# Patient Record
Sex: Male | Born: 1985 | Race: White | Hispanic: No | Marital: Single | Smoking: Current every day smoker
Health system: Southern US, Community
[De-identification: ages and names within clinical notes are randomized; demographics above are authoritative.]

## PROBLEM LIST (undated history)

## (undated) DIAGNOSIS — L408 Other psoriasis: Secondary | ICD-10-CM

## (undated) DIAGNOSIS — L4050 Arthropathic psoriasis, unspecified: Secondary | ICD-10-CM

## (undated) DIAGNOSIS — N411 Chronic prostatitis: Secondary | ICD-10-CM

## (undated) DIAGNOSIS — M461 Sacroiliitis, not elsewhere classified: Secondary | ICD-10-CM

## (undated) DIAGNOSIS — K589 Irritable bowel syndrome without diarrhea: Secondary | ICD-10-CM

## (undated) HISTORY — DX: Arthropathic psoriasis, unspecified: L40.50

## (undated) HISTORY — DX: Sacroiliitis, not elsewhere classified: M46.1

## (undated) HISTORY — DX: Chronic prostatitis: N41.1

## (undated) HISTORY — DX: Irritable bowel syndrome, unspecified: K58.9

## (undated) HISTORY — DX: Other psoriasis: L40.8

## (undated) HISTORY — PX: CYSTOSCOPY: SUR368

## (undated) HISTORY — PX: ESOPHAGOGASTRODUODENOSCOPY: SHX1529

---
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
## 2004-03-10 ENCOUNTER — Ambulatory Visit: Payer: Self-pay | Admitting: Family Medicine

## 2004-03-25 ENCOUNTER — Encounter: Admission: RE | Admit: 2004-03-25 | Discharge: 2004-03-25 | Payer: Self-pay | Admitting: Gastroenterology

## 2004-06-18 ENCOUNTER — Encounter: Admission: RE | Admit: 2004-06-18 | Discharge: 2004-06-18 | Payer: Self-pay | Admitting: Gastroenterology

## 2006-06-24 DIAGNOSIS — I10 Essential (primary) hypertension: Secondary | ICD-10-CM | POA: Insufficient documentation

## 2006-06-24 DIAGNOSIS — L408 Other psoriasis: Secondary | ICD-10-CM

## 2006-06-24 DIAGNOSIS — G44209 Tension-type headache, unspecified, not intractable: Secondary | ICD-10-CM

## 2006-06-24 DIAGNOSIS — R319 Hematuria, unspecified: Secondary | ICD-10-CM | POA: Insufficient documentation

## 2006-06-24 DIAGNOSIS — R000 Tachycardia, unspecified: Secondary | ICD-10-CM | POA: Insufficient documentation

## 2006-06-24 DIAGNOSIS — L4050 Arthropathic psoriasis, unspecified: Secondary | ICD-10-CM | POA: Insufficient documentation

## 2006-06-24 DIAGNOSIS — G43909 Migraine, unspecified, not intractable, without status migrainosus: Secondary | ICD-10-CM | POA: Insufficient documentation

## 2006-06-24 DIAGNOSIS — L219 Seborrheic dermatitis, unspecified: Secondary | ICD-10-CM

## 2006-11-26 ENCOUNTER — Ambulatory Visit: Payer: Self-pay | Admitting: Internal Medicine

## 2006-11-26 DIAGNOSIS — K589 Irritable bowel syndrome without diarrhea: Secondary | ICD-10-CM | POA: Insufficient documentation

## 2007-11-25 ENCOUNTER — Emergency Department (HOSPITAL_COMMUNITY): Admission: EM | Admit: 2007-11-25 | Discharge: 2007-11-25 | Payer: Self-pay | Admitting: *Deleted

## 2008-02-29 ENCOUNTER — Encounter: Payer: Self-pay | Admitting: Internal Medicine

## 2009-11-07 ENCOUNTER — Encounter: Payer: Self-pay | Admitting: Internal Medicine

## 2009-11-19 ENCOUNTER — Telehealth (INDEPENDENT_AMBULATORY_CARE_PROVIDER_SITE_OTHER): Payer: Self-pay | Admitting: *Deleted

## 2009-11-21 ENCOUNTER — Ambulatory Visit: Payer: Self-pay | Admitting: Internal Medicine

## 2009-11-21 DIAGNOSIS — R634 Abnormal weight loss: Secondary | ICD-10-CM

## 2009-11-21 DIAGNOSIS — R079 Chest pain, unspecified: Secondary | ICD-10-CM | POA: Insufficient documentation

## 2009-11-22 LAB — CONVERTED CEMR LAB: Free T4: 1.06 ng/dL (ref 0.60–1.60)

## 2010-05-27 NOTE — Progress Notes (Signed)
  Phone Note Call from Patient Call back at Home Phone [phone] Call back at [phone]   Caller: Cathlean Cower Edelstein-mother Call For: Dr.Letvak Summary of Call: Pt. has lost 80 lbs in the last 2 yrs.  He has no appetite.   Pt is fatigued.  He has been having chest pains for months.   He has tsoritic arthritis. Pt hasn't been seen for almost 3 years.  Pt. can only come in to see you on Thursday.  Can you see him on Thursday? Initial call taken by: Beau Fanny,  November 19, 2009 8:43 AM  Follow-up for Phone Call        can add at 1:30PM I already have 1:45PM I think Follow-up by: Cindee Salt MD,  November 19, 2009 9:04 AM  Additional Follow-up for Phone Call Additional follow up Details #1::        Pt's mother scheduled appt. on 11/21/09 @ 1:30. Additional Follow-up by: Beau Fanny,  November 19, 2009 9:19 AM

## 2010-05-27 NOTE — Assessment & Plan Note (Signed)
Summary: 1:30 LOSING WEIGHT,FATIGUE,CHEST   Vital Signs:  Patient profile:   25 year old male Height:      69.5 inches Weight:      148.25 pounds BMI:     21.66 Temp:     98.1 degrees F oral Pulse rate:   92 / minute Pulse rhythm:   regular BP sitting:   106 / 60  (left arm) Cuff size:   regular  Vitals Entered By: Sydell Axon LPN (November 21, 2009 1:21 PM) CC: Losing weight, fatigue and chest hurts   History of Present Illness: Has had a big weight loss 25-30 # down since inital visit 80# down from 6-7 years ago  Not much appetite  Has chest pain and feels it pop going back 1.5 years but worse now Pops when he sneezes. Pain is fairly constant  No nausea or vomiting Freq blood in stools in past but not lately bowels are pretty regular  ON methotrexate for psoriasis and sacroiliitis Dr Dareen Piano is his rheumatologist  Gets tremendous pain in left lower flank in bed when he rolls over, it can be excrutiating----resolves if he stays on his back Notes urinary frequency  Preventive Screening-Counseling & Management  Alcohol-Tobacco     Smoking Status: current  Allergies: No Known Drug Allergies  Past History:  Past Medical History:  IBS Psoriasis and sacroillitis  Past Surgical History: 12/02 - renal U/S nl  Negative urine metanephrines - 04/18/2001 04/18/2001, 24hr urine arsenic screen neg -testing for contam`d water  04/03/2003, Abdominal MRI Negative   04/05/2002, colonoscopy `02, 7/04w/ mult hyperplastic polyps   11/14/2002, cystoscopy normal 07/31/2002, echo - 1/03 nl fxn   05/13/2001, EEG - normal Dr. Sharene Skeans  10/04/2002, EGD w/bx for N/V by Dr. Matthias Hughs  11/14/2002, ETT- WNL, poor aerobic fitness McDiarmid - 12/21/2001,  Social History: Occupation: Horticulturist, commercial job Single Alcohol use-occ Drug use-no Current Smoker--started smoking  ~ 3/11 due to stress Smoking Status:  current  Review of Systems General:  Complains of fever and loss of  appetite; denies fatigue; not sleeping well--freq awakening. Eyes:  Denies double vision and vision loss-1 eye. ENT:  Complains of sore throat; teeth okay--overdue for dentist. CV:  Complains of chest pain or discomfort and difficulty breathing while lying down; denies fainting, lightheadness, palpitations, and shortness of breath with exertion; gets SOB with the flank pain when he rolls over in bed. Resp:  Denies cough and shortness of breath. GI:  Complains of bloody stools, indigestion, and loss of appetite; denies nausea and vomiting; last blood in stool was about 6 weeks ago occ heartburn. GU:  Complains of urinary hesitancy; denies dysuria; occ trouble starting urine when standing No history of STD. MS:  Complains of joint pain and low back pain; denies joint swelling; some swelling in right 1st and 2nd toes. Derm:  psoriasis controlled with MTX. Neuro:  Complains of headaches; denies numbness, tingling, and weakness; rare headaches. Psych:  Denies anxiety and depression. Heme:  Denies abnormal bruising and enlarge lymph nodes. Allergy:  Denies seasonal allergies and sneezing.  Physical Exam  General:  alert.  NAD Mouth:  good dentition, pharynx pink and moist, no erythema, no exudates, no posterior lymphoid hypertrophy, no lesions, and no aphthous ulcers.   Neck:  supple, no masses, no thyromegaly, and no cervical lymphadenopathy.   Chest Wall:  seems to have localized tenderness over right 2nd costochondral junction Lungs:  normal respiratory effort, no intercostal retractions, no accessory muscle use, and normal breath sounds.  Heart:  normal rate, regular rhythm, no murmur, and no gallop.   Abdomen:  soft, non-tender, no masses, no inguinal hernia, no hepatomegaly, and no splenomegaly.   Genitalia:  wouldn't permit exam Msk:  no joint tenderness and no joint swelling.   Extremities:  no edema  Neurologic:  alert & oriented X3, strength normal in all extremities, and gait  normal.   Skin:  scattered psoriatic lesions on abdomen Axillary Nodes:  No palpable lymphadenopathy Inguinal Nodes:  No significant adenopathy Psych:  normally interactive, good eye contact, not anxious appearing, and not depressed appearing.     Impression & Recommendations:  Problem # 1:  WEIGHT LOSS, ABNORMAL (ICD-783.21) Assessment New  no worrisome systemic symptoms and exam benign recent blood work okay except elevated CRP seems to be focused on GI---decreased appetite and still some blood in stools  will check TSH, free T4, celiac panel Needs reeval by Dr Matthias Hughs  Orders: Specimen Handling (84696) T- * Misc. Laboratory test [phone]) Gastroenterology Referral (GI)  Problem # 2:  CHEST PAIN (ICD-786.50) Assessment: New CXR looks normal seems likely to be costochondritis  Orders: EKG w/ Interpretation (93000) TLB-TSH (Thyroid Stimulating Hormone) (84443-TSH) Venipuncture (41324) TLB-T4 (Thyrox), Free (40102-VO5D) T-2 View CXR (71020TC)  Complete Medication List: 1)  Methotrexate 2.5 Mg Tabs (Methotrexate sodium) .... Take four by mouth once a week 2)  Folic Acid 1 Mg Tabs (Folic acid) .Marland Kitchen.. 1 tab daily  Patient Instructions: 1)  Please try carnation instant breakfast with whole milk daily in addition to your regular diet 2)  Please schedule a follow-up appointment in 2 months.  3)  Referral Appointment Information 4)  Day/Date: 5)  Time: 6)  Place/MD: 7)  Address: 8)  Phone/Fax: 9)  Patient given appointment information. Information/Orders faxed/mailed.  Current Allergies (reviewed today): No known allergies    EKG  Procedure date:  11/21/2009  Findings:      sinus @82  Normal variant   Appended Document: 1:30 LOSING WEIGHT,FATIGUE,CHEST

## 2010-07-25 ENCOUNTER — Emergency Department (HOSPITAL_COMMUNITY): Payer: BC Managed Care – PPO

## 2010-07-25 ENCOUNTER — Emergency Department (HOSPITAL_COMMUNITY)
Admission: EM | Admit: 2010-07-25 | Discharge: 2010-07-25 | Disposition: A | Discharge status: Home / Self Care | Payer: BC Managed Care – PPO | Attending: Emergency Medicine | Admitting: Emergency Medicine

## 2010-07-25 DIAGNOSIS — R55 Syncope and collapse: Secondary | ICD-10-CM | POA: Insufficient documentation

## 2010-07-25 DIAGNOSIS — R110 Nausea: Secondary | ICD-10-CM | POA: Insufficient documentation

## 2010-07-25 DIAGNOSIS — Z79899 Other long term (current) drug therapy: Secondary | ICD-10-CM | POA: Insufficient documentation

## 2010-07-25 DIAGNOSIS — L4050 Arthropathic psoriasis, unspecified: Secondary | ICD-10-CM | POA: Insufficient documentation

## 2010-07-25 LAB — DIFFERENTIAL
Basophils Absolute: 0.1 10*3/uL (ref 0.0–0.1)
Eosinophils Absolute: 0.2 10*3/uL (ref 0.0–0.7)
Eosinophils Relative: 3 % (ref 0–5)
Lymphocytes Relative: 20 % (ref 12–46)
Lymphs Abs: 1.7 10*3/uL (ref 0.7–4.0)
Monocytes Absolute: 0.6 10*3/uL (ref 0.1–1.0)
Neutro Abs: 5.9 10*3/uL (ref 1.7–7.7)
Neutrophils Relative %: 70 % (ref 43–77)

## 2010-07-25 LAB — POCT I-STAT, CHEM 8
BUN: 12 mg/dL (ref 6–23)
Calcium, Ion: 1.1 mmol/L — ABNORMAL LOW (ref 1.12–1.32)
Chloride: 104 mEq/L (ref 96–112)
Creatinine, Ser: 1 mg/dL (ref 0.4–1.5)
Glucose, Bld: 95 mg/dL (ref 70–99)
HCT: 44 % (ref 39.0–52.0)
Hemoglobin: 15 g/dL (ref 13.0–17.0)
Potassium: 3.9 mEq/L (ref 3.5–5.1)
Sodium: 141 mEq/L (ref 135–145)

## 2010-07-25 LAB — CBC
HCT: 43 % (ref 39.0–52.0)
Hemoglobin: 14.3 g/dL (ref 13.0–17.0)
MCV: 87.4 fL (ref 78.0–100.0)
RBC: 4.92 MIL/uL (ref 4.22–5.81)
WBC: 8.4 10*3/uL (ref 4.0–10.5)

## 2010-07-25 LAB — URINE MICROSCOPIC-ADD ON

## 2010-07-25 LAB — URINALYSIS, ROUTINE W REFLEX MICROSCOPIC
Bilirubin Urine: NEGATIVE
Glucose, UA: NEGATIVE mg/dL
Hgb urine dipstick: NEGATIVE
Nitrite: NEGATIVE
Protein, ur: 30 mg/dL — AB
Specific Gravity, Urine: 1.026 (ref 1.005–1.030)
pH: 8 (ref 5.0–8.0)

## 2010-07-28 LAB — GLUCOSE, CAPILLARY: Glucose-Capillary: 91 mg/dL (ref 70–99)
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
## 2010-10-21 ENCOUNTER — Telehealth: Payer: Self-pay | Admitting: *Deleted

## 2010-10-21 NOTE — Telephone Encounter (Signed)
Pt's mother has faxed lab results from rheumatology office that she would like you to review.  She has included a note stating that she is concerned about the pt, he is continuing to lose weight and tires out easily.  This is on your desk.

## 2010-10-21 NOTE — Telephone Encounter (Signed)
I will review all this tomorrow and give her a call

## 2010-10-22 ENCOUNTER — Encounter: Payer: Self-pay | Admitting: Internal Medicine

## 2010-10-22 NOTE — Telephone Encounter (Signed)
Discussed with mom MPV is not sig issue--rest of labs are normal Weight is up 6-7# from my last visit last summer (but down 10# from this winter) Discussed that he needs to be eating right and drinking plenty of fluids when working out in this heat I can see him if there are ongoing concerns

## 2010-12-09 ENCOUNTER — Inpatient Hospital Stay: Payer: Self-pay | Admitting: Internal Medicine

## 2010-12-10 DIAGNOSIS — R55 Syncope and collapse: Secondary | ICD-10-CM

## 2011-01-23 LAB — CBC
HCT: 42
Hemoglobin: 14.1
MCHC: 33.7
MCV: 87.2
Platelets: 211
RBC: 4.81
RDW: 12.8
WBC: 8.7

## 2011-01-23 LAB — URINE MICROSCOPIC-ADD ON

## 2011-01-23 LAB — POCT I-STAT, CHEM 8
BUN: 10
Calcium, Ion: 1.23
Chloride: 104
Creatinine, Ser: 1.1
Glucose, Bld: 101 — ABNORMAL HIGH
HCT: 43
Hemoglobin: 14.6
Potassium: 3.8
Sodium: 142
TCO2: 28

## 2011-01-23 LAB — DIFFERENTIAL
Basophils Absolute: 0
Basophils Relative: 0
Eosinophils Absolute: 0.1
Eosinophils Relative: 1
Lymphocytes Relative: 17
Lymphs Abs: 1.5
Monocytes Absolute: 0.4
Monocytes Relative: 4
Neutro Abs: 6.8
Neutrophils Relative %: 78 — ABNORMAL HIGH

## 2011-01-23 LAB — URINALYSIS, ROUTINE W REFLEX MICROSCOPIC
Bilirubin Urine: NEGATIVE
Glucose, UA: NEGATIVE
Ketones, ur: NEGATIVE
Leukocytes, UA: NEGATIVE
Nitrite: NEGATIVE
Protein, ur: NEGATIVE
Specific Gravity, Urine: 1.02
Urobilinogen, UA: 1
pH: 7.5

## 2011-06-18 ENCOUNTER — Emergency Department: Payer: Self-pay | Admitting: Emergency Medicine

## 2011-06-18 LAB — BASIC METABOLIC PANEL
Calcium, Total: 9.2 mg/dL (ref 8.5–10.1)
Chloride: 103 mmol/L (ref 98–107)
Co2: 25 mmol/L (ref 21–32)
Glucose: 122 mg/dL — ABNORMAL HIGH (ref 65–99)
Potassium: 3.3 mmol/L — ABNORMAL LOW (ref 3.5–5.1)
Sodium: 144 mmol/L (ref 136–145)

## 2011-06-18 LAB — URINALYSIS, COMPLETE
Bacteria: NONE SEEN
Bilirubin,UR: NEGATIVE
Glucose,UR: NEGATIVE mg/dL (ref 0–75)
Leukocyte Esterase: NEGATIVE
Ph: 6 (ref 4.5–8.0)
RBC,UR: 116 /HPF (ref 0–5)
Squamous Epithelial: NONE SEEN

## 2011-06-18 LAB — CBC
HGB: 15.1 g/dL (ref 13.0–18.0)
MCH: 29.2 pg (ref 26.0–34.0)
MCHC: 33.2 g/dL (ref 32.0–36.0)
MCV: 88 fL (ref 80–100)
Platelet: 277 10*3/uL (ref 150–440)
RDW: 14.9 % — ABNORMAL HIGH (ref 11.5–14.5)

## 2011-09-22 ENCOUNTER — Emergency Department: Payer: Self-pay | Admitting: Emergency Medicine

## 2011-09-22 LAB — BASIC METABOLIC PANEL
BUN: 9 mg/dL (ref 7–18)
Calcium, Total: 9.4 mg/dL (ref 8.5–10.1)
Chloride: 104 mmol/L (ref 98–107)
EGFR (African American): >60
EGFR (Non-African Amer.): >60
Glucose: 153 mg/dL — ABNORMAL HIGH (ref 65–99)
Osmolality: 287 (ref 275–301)
Potassium: 3.7 mmol/L (ref 3.5–5.1)

## 2011-09-22 LAB — URINALYSIS, COMPLETE
Bilirubin,UR: NEGATIVE
Nitrite: NEGATIVE
Ph: 5 (ref 4.5–8.0)
Protein: 100
RBC,UR: 274 /HPF (ref 0–5)
Squamous Epithelial: <1
WBC UR: 4 /HPF (ref 0–5)

## 2011-09-22 LAB — CBC
HGB: 15.5 g/dL (ref 13.0–18.0)
MCH: 28.9 pg (ref 26.0–34.0)
MCHC: 32.6 g/dL (ref 32.0–36.0)
MCV: 89 fL (ref 80–100)
Platelet: 277 10*3/uL (ref 150–440)
WBC: 13.9 10*3/uL — ABNORMAL HIGH (ref 3.8–10.6)

## 2011-12-29 ENCOUNTER — Ambulatory Visit (INDEPENDENT_AMBULATORY_CARE_PROVIDER_SITE_OTHER): Payer: Self-pay | Admitting: Internal Medicine

## 2011-12-29 ENCOUNTER — Encounter: Payer: Self-pay | Admitting: Internal Medicine

## 2011-12-29 VITALS — BP 108/70 | HR 81 | Temp 98.0°F | Ht 69.0 in | Wt 130.0 lb

## 2011-12-29 DIAGNOSIS — N419 Inflammatory disease of prostate, unspecified: Secondary | ICD-10-CM

## 2011-12-29 DIAGNOSIS — R350 Frequency of micturition: Secondary | ICD-10-CM

## 2011-12-29 LAB — POCT URINALYSIS DIPSTICK
Bilirubin, UA: NEGATIVE
Blood, UA: NEGATIVE
Glucose, UA: NEGATIVE
Ketones, UA: NEGATIVE
Leukocytes, UA: NEGATIVE
Nitrite, UA: NEGATIVE
Protein, UA: NEGATIVE
Spec Grav, UA: 1.01
Urobilinogen, UA: NEGATIVE
pH, UA: 8

## 2011-12-29 MED ORDER — CIPROFLOXACIN HCL 500 MG PO TABS: 500.0000 mg | ORAL_TABLET | Freq: Two times a day (BID) | ORAL | Status: AC

## 2011-12-29 NOTE — Progress Notes (Signed)
  Subjective:    Patient ID: Sean Walters, male    DOB: 09/28/1985, 26 y.o.   MRN: [id number]  HPI Out of work---disabled due to his psoriatic arthritis No insurance Getting embrel for free Not seeing rheumatologist in a while ---last was Dr Leonia Corona that was a while ago Mom helped hiim get embrel but ???noone monitoring it  Still smoking [place] be considering stopping 1-800 QUIT NOW info given  Having "kidney" issues Had kidney stones a few months ago--got analgesics and Chopin have passed it Still gets sense of something at end of penis---not coming out Took some of brother's left over antibiotics (for sore throat)----[place] have helped some  Never had intercourse Gets some frequency and some urgency No burning dysuria---did have burning only after 1st antibiotic dose Current Outpatient Prescriptions on File Prior to Visit  Medication Sig Dispense Refill  . etanercept (ENBREL) 50 MG/ML injection Inject 50 mg into the skin once a week.        No Known Allergies  Past Medical History  Diagnosis Date  . Psoriatic arthritis     Dr Gavin Potters  . Irritable bowel syndrome   . Other psoriasis   . Sacroiliitis     Past Surgical History  Procedure Date  . US echocardiography 07/31/02  . Esophagogastroduodenoscopy [id number]  . Cystoscopy [id number]    normal    Family History  Problem Relation Age of Onset  . Coronary artery disease Neg Hx   . Hypertension Neg Hx   . Diabetes Neg Hx     History   Social History  . Marital Status: Single    Spouse Name: N/A    Number of Children: N/A  . Years of Education: N/A   Occupational History  . Out of work--disabled due to arthritis    Social History Main Topics  . Smoking status: Current Everyday Smoker -- 2 years    Types: Cigarettes  . Smokeless tobacco: Never Used   Comment: gave 1-800-quit now  . Alcohol Use: Yes  . Drug Use: No  . Sexually Active: Not on file   Other Topics Concern  . Not on file   Social History  Narrative  . No narrative on file   Review of Systems Some left lumbar back pain Has lost even more weight    Objective:   Physical Exam  Constitutional: He appears well-developed. No distress.  Genitourinary:       Normal urethra No scrotal pain or swelling Rectal exam shows no mass Prostate is not boggy but is tender with pain radiating to penis          Assessment & Plan:

## 2011-12-29 NOTE — Assessment & Plan Note (Signed)
Seems the most likely diagnosis On embrel but without rheum follow up--he is working on disability or Medicaid Not clear this could be related Will treat with cipro for 3 weeks Really needs to get back with Dr Gavin Potters

## 2012-02-22 ENCOUNTER — Ambulatory Visit: Payer: Self-pay | Admitting: Internal Medicine

## 2012-02-22 ENCOUNTER — Encounter: Payer: Self-pay | Admitting: Internal Medicine

## 2012-02-22 ENCOUNTER — Ambulatory Visit (INDEPENDENT_AMBULATORY_CARE_PROVIDER_SITE_OTHER): Payer: Self-pay | Admitting: Internal Medicine

## 2012-02-22 VITALS — BP 120/70 | HR 96 | Temp 98.2°F | Wt 134.0 lb

## 2012-02-22 DIAGNOSIS — R55 Syncope and collapse: Secondary | ICD-10-CM | POA: Insufficient documentation

## 2012-02-22 DIAGNOSIS — L408 Other psoriasis: Secondary | ICD-10-CM

## 2012-02-22 MED ORDER — DOXYCYCLINE HYCLATE 100 MG PO TABS: 100.0000 mg | ORAL_TABLET | Freq: Two times a day (BID) | ORAL | Status: DC

## 2012-02-22 NOTE — Patient Instructions (Signed)
This spell was vasovagal or neurogenic syncope.

## 2012-02-22 NOTE — Assessment & Plan Note (Signed)
Recurred ?related to the enbrel Will retreat for 3 weeks
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
## 2012-02-22 NOTE — Assessment & Plan Note (Signed)
Classic story and this has happened to him before Discussed that this is not a seizure No EKG needed Will defer labs since no insurance but if recurrent symptoms, would need to check  Counseled about proper way to get down immediately if recurrent symptoms

## 2012-02-23 ENCOUNTER — Telehealth: Payer: Self-pay

## 2012-02-23 MED ORDER — DOXYCYCLINE MONOHYDRATE 100 MG PO CAPS: 100.0000 mg | ORAL_CAPSULE | Freq: Two times a day (BID) | ORAL | Status: DC

## 2012-02-23 NOTE — Telephone Encounter (Signed)
Please change the Rx to the doxycycline monohydrate 100mg  #42 x 0

## 2012-02-23 NOTE — Telephone Encounter (Signed)
rx sent to pharmacy by e-script  

## 2012-02-23 NOTE — Telephone Encounter (Signed)
Vibra tab which is Doxycycline hyclate has shortage of raw material and price increase. Pt has no insurance and med cost $150. Pt could not afford. Request another med substitute; April said has Doxycycline monohydrate  would cost $25.00 or whatever med Dr Alphonsus Sias would chose to substitute.Please advise.

## 2012-02-24 ENCOUNTER — Telehealth: Payer: Self-pay | Admitting: *Deleted

## 2012-02-24 ENCOUNTER — Other Ambulatory Visit: Payer: Self-pay | Admitting: Internal Medicine

## 2012-02-24 ENCOUNTER — Other Ambulatory Visit: Payer: Self-pay

## 2012-02-24 DIAGNOSIS — R55 Syncope and collapse: Secondary | ICD-10-CM

## 2012-02-24 NOTE — Telephone Encounter (Signed)
Mom calling asking for lab work on patient, since pt has been approved for [employer]	[place], mom would like labs for enbrel and anything else he can get. Please advise what labs need to be drawn and if they would be covered under the cone payment plan.

## 2012-02-24 NOTE — Telephone Encounter (Signed)
I will ask Sean Walters to comment on whether they are covered but I think they are He needs CBC with diff, met b, hepatic ----long term use of high risk meds TSH--syncope

## 2012-02-24 NOTE — Telephone Encounter (Signed)
Spoke with mom and scheduled lab appt for 02/25/12

## 2012-02-25 ENCOUNTER — Other Ambulatory Visit (INDEPENDENT_AMBULATORY_CARE_PROVIDER_SITE_OTHER): Payer: Self-pay

## 2012-02-25 DIAGNOSIS — R55 Syncope and collapse: Secondary | ICD-10-CM

## 2012-02-25 LAB — CBC WITH DIFFERENTIAL/PLATELET
Basophils Relative: 1 % (ref 0.0–3.0)
Eosinophils Relative: 4.6 % (ref 0.0–5.0)
HCT: 44.9 % (ref 39.0–52.0)
Lymphs Abs: 2.7 10*3/uL (ref 0.7–4.0)
MCV: 90.5 fl (ref 78.0–100.0)
Monocytes Absolute: 0.5 10*3/uL (ref 0.1–1.0)
Neutro Abs: 5 10*3/uL (ref 1.4–7.7)
RBC: 4.97 Mil/uL (ref 4.22–5.81)
WBC: 8.6 10*3/uL (ref 4.5–10.5)

## 2012-02-25 LAB — COMPREHENSIVE METABOLIC PANEL
BUN: 13 mg/dL (ref 6–23)
CO2: 32 mEq/L (ref 19–32)
Creatinine, Ser: 0.9 mg/dL (ref 0.4–1.5)
GFR: 102.3 mL/min (ref >60.00)
Glucose, Bld: 91 mg/dL (ref 70–99)
Sodium: 140 mEq/L (ref 135–145)
Total Bilirubin: 1.2 mg/dL (ref 0.3–1.2)
Total Protein: 7 g/dL (ref 6.0–8.3)

## 2012-02-26 ENCOUNTER — Encounter: Payer: Self-pay | Admitting: Family Medicine

## 2012-04-05 ENCOUNTER — Ambulatory Visit (INDEPENDENT_AMBULATORY_CARE_PROVIDER_SITE_OTHER): Payer: Self-pay | Admitting: Internal Medicine

## 2012-04-05 ENCOUNTER — Encounter: Payer: Self-pay | Admitting: Internal Medicine

## 2012-04-05 VITALS — BP 116/78 | HR 92 | Temp 98.5°F | Resp 20 | Ht 69.0 in | Wt 135.5 lb

## 2012-04-05 DIAGNOSIS — N411 Chronic prostatitis: Secondary | ICD-10-CM | POA: Insufficient documentation

## 2012-04-05 MED ORDER — TAMSULOSIN HCL 0.4 MG PO CAPS: 0.4000 mg | ORAL_CAPSULE | Freq: Every day | ORAL | Status: DC

## 2012-04-05 MED ORDER — SULFAMETHOXAZOLE-TRIMETHOPRIM 800-160 MG PO TABS: 1.0000 | ORAL_TABLET | Freq: Two times a day (BID) | ORAL | Status: AC

## 2012-04-05 NOTE — Progress Notes (Signed)
  Subjective:    Patient ID: Sean Walters, male    DOB: [birth year], 26 y.o.   MRN: [id number]  HPI Had the prostate symptoms since July or August After visit in September, took the 3 weeks of antibiotics---did help slightly but has persisted  Has discomfort---sense of urine there or urgency (but it doesn't come out) Voids okay Slight burning with voiding No hematuria Still has not had sexual activity ever  Current Outpatient Prescriptions on File Prior to Visit  Medication Sig Dispense Refill  . etanercept (ENBREL) 50 MG/ML injection Inject 50 mg into the skin once a week.      . doxycycline (MONODOX) 100 MG capsule Take 1 capsule (100 mg total) by mouth 2 (two) times daily.  42 capsule  0    No Known Allergies  Past Medical History  Diagnosis Date  . Psoriatic arthritis     Dr Gavin Potters  . Irritable bowel syndrome   . Other psoriasis   . Sacroiliitis     Past Surgical History  Procedure Date  . US echocardiography 07/31/02  . Esophagogastroduodenoscopy [id number]  . Cystoscopy [id number]    normal    Family History  Problem Relation Age of Onset  . Coronary artery disease Neg Hx   . Hypertension Neg Hx   . Diabetes Neg Hx     History   Social History  . Marital Status: Single    Spouse Name: N/A    Number of Children: N/A  . Years of Education: N/A   Occupational History  . Out of work--disabled due to arthritis    Social History Main Topics  . Smoking status: Current Every Day Smoker -- 2 years    Types: Cigarettes  . Smokeless tobacco: Never Used     Comment: gave 1-800-quit now  . Alcohol Use: Yes  . Drug Use: No  . Sexually Active: Not on file   Other Topics Concern  . Not on file   Social History Narrative  . No narrative on file   Review of Systems No fever Bowels are fine  Appetite is okay     Objective:   Physical Exam  Constitutional: He appears well-developed and well-nourished. No distress.  Abdominal: Soft. He exhibits no mass.  There is no tenderness. There is no guarding.       No suprapubic dullness  Genitourinary:       Left spermatocele but no scrotal or penile lesions or inflammation           Assessment & Plan:

## 2012-04-05 NOTE — Assessment & Plan Note (Signed)
Ongoing symptoms No clear response to the doxy Can't afford the cipro (was very expensive)  Will treat with septra for 3-6 weeks Try tamsulosin also

## 2012-06-11 ENCOUNTER — Other Ambulatory Visit: Payer: Self-pay

## 2012-12-13 ENCOUNTER — Ambulatory Visit (INDEPENDENT_AMBULATORY_CARE_PROVIDER_SITE_OTHER): Payer: Self-pay | Admitting: Family Medicine

## 2012-12-13 ENCOUNTER — Encounter: Payer: Self-pay | Admitting: Family Medicine

## 2012-12-13 VITALS — BP 100/70 | HR 105 | Temp 98.5°F | Wt 130.5 lb

## 2012-12-13 DIAGNOSIS — N411 Chronic prostatitis: Secondary | ICD-10-CM

## 2012-12-13 LAB — POCT URINALYSIS DIPSTICK
Bilirubin, UA: NEGATIVE
Glucose, UA: NEGATIVE
Nitrite, UA: NEGATIVE
pH, UA: 7.5

## 2012-12-13 MED ORDER — SULFAMETHOXAZOLE-TMP DS 800-160 MG PO TABS: 1.0000 | ORAL_TABLET | Freq: Two times a day (BID) | ORAL | Status: DC

## 2012-12-13 NOTE — Addendum Note (Signed)
Addended by: Josph Macho A on: 12/13/2012 12:25 PM   Modules accepted: Orders

## 2012-12-13 NOTE — Progress Notes (Signed)
  Subjective:    Patient ID: Sean Walters, male    DOB: December 05, 1985, 27 y.o.   MRN: [id number]  HPI CC: prostatitis?  Pleasant 27 yo with h/o psoriatic arthritis, chronic prostatitis, IBS presents today with 2wk h/o incomplete emptying, dysuria.  Feels need to void but no urine comes out at times, other time incomplete emptying.  Some nausea. Off enbrel for the last 4 months.  No fevers/chills, vomiting. No frequency, urgency, hematuria. H/o bulging disc with arthritis, chronic back pain from this.  H/o chronic prostate infection in past, doxy 2wk course didn't help.  Improved with bactrim 4 wk course.  Also treated with flomax which Diaz have helped.  Tachycardia - states heart rate tends to run in 100s.  However today even higher.  Staying hydrated with gatorade.  Drinks sodas regularly.  Does not like taste of water.  Past Medical History  Diagnosis Date  . Psoriatic arthritis     Dr Gavin Potters  . Irritable bowel syndrome   . Other psoriasis   . Sacroiliitis     Review of Systems Per HPI    Objective:   Physical Exam  Nursing note and vitals reviewed. Constitutional: He appears well-developed and well-nourished. No distress.  Abdominal: Soft. Normal appearance and bowel sounds are normal. He exhibits no distension and no mass. There is no tenderness. There is no rigidity, no rebound, no guarding, no CVA tenderness and negative Murphy's sign.  Genitourinary: Rectum normal. Rectal exam shows no external hemorrhoid, no internal hemorrhoid, no fissure, no mass, no tenderness and anal tone normal. Prostate is enlarged (slightly boggy. pain with palpation referred to head of penis) and tender.  Penile psoriatic plaques  Musculoskeletal: He exhibits no edema.       Assessment & Plan:

## 2012-12-13 NOTE — Assessment & Plan Note (Signed)
Never fully cleared per patient. This is how prior prostatitis presented. Anticipate flare of chronic prostatitis. Will treat with another prolonged bactrim course - 4 wks with 2 refills.

## 2012-12-13 NOTE — Patient Instructions (Signed)
This seems like another chronic prostate infection.  Treat with bactrim twice daily for next 3 months (2 refills provided today). If not improving with this, or any worsening, please return to see [place] or seek urgent care.  Prostatitis The prostate gland is about the size and shape of a walnut. It is located just below your bladder. It produces one of the components of semen, which is made up of sperm and the fluids that help nourish and transport it out from the testicles. Prostatitis is redness, soreness, and swelling (inflammation) of the prostate gland.  There are 3 types of prostatitis:  Acute bacterial prostatitis This is the least common type of prostatitis. It starts quickly and usually leads to a bladder infection. It can occur at any age.  Chronic bacterial prostatitis This is a persistent bacterial infection in the prostate. It usually develops from repeated acute bacterial prostatitis or acute bacterial prostatitis that was not properly treated. It can occur in men of any age but is most common in middle-aged men whose prostate has begun to enlarge.   Chronic prostatitis chronic pelvic pain syndrome This is the most common type of prostatitis. It is inflammation of the prostate gland that is not caused by a bacterial infection. The cause is unknown. CAUSES The cause of acute and chronic bacterial prostatitis is a bacterial infection. The exact cause of chronic prostatitis and chronic pelvic pain syndrome and asymptomatic inflammatory prostatitis is unknown.  SYMPTOMS  Symptoms can vary depending upon the type of prostatitis that exists. There can also be overlap in symptoms. Possible symptoms for each type of prostatitis are listed below. Acute bacterial prostatitis  Painful urination.  Fever or chills.  Muscle or joint pains.  Low back pain.  Low abdominal pain.  Inability to empty bladder completely.  Sudden urge to urinate.  Frequent urination.  Difficulty starting  urine stream.  Weak urine stream.  Discharge from the urethra.  Dribbling after urination.  Rectal pain.  Pain in the testicles, penis, or tip of the penis.  Pain in the space between the anus and scrotum (perineum).  Problems with sexual function.  Painful ejaculation.  Bloody semen. Chronic bacterial prostatitis  The symptoms are similar to those of acute bacterial prostatitis, but they usually are much less severe. Fever, chills, and muscle and joint pain are not associated with chronic bacterial prostatitis. Chronic prostatitis chronic pelvic pain syndrome  Symptoms typically include a dull ache in the scrotum and the perineum. DIAGNOSIS  In order to diagnose prostatitis, your caregiver will ask about your symptoms. If acute or chronic bacterial prostatitis is suspected, a urine sample will be taken and tested (urinalysis). This is to see if there is bacteria in your urine. If the urinalysis result is negative for bacteria, your caregiver Rendleman use a finger to feel your prostate (digital rectal exam). This exam helps your caregiver determine if your prostate is swollen and tender. TREATMENT  Treatment for prostatitis depends on the cause. If a bacterial infection is the cause, it can be treated with antibiotic medicine. In cases of chronic bacterial prostatitis, the use of antibiotics for up to 1 month Aubry be necessary. Your caregiver Avis instruct you to take sitz baths to help relieve pain. A sitz bath is a bath of hot water in which your hips and buttocks are under water. HOME CARE INSTRUCTIONS   Take all medicines as directed by your caregiver.  Take sitz baths as directed by your caregiver. SEEK MEDICAL CARE IF:  Your symptoms get worse, not better.  You have a fever. SEEK IMMEDIATE MEDICAL CARE IF:   You have chills.  You feel nauseous or vomit.  You feel lightheaded or faint.  You are unable to urinate.  You have blood or blood clots in your urine. Document  Released: 04/10/2000 Document Revised: 07/06/2011 Document Reviewed: 03/16/2011 [facility] Patient Information 2014 [place], [place].

## 2012-12-16 ENCOUNTER — Encounter: Payer: Self-pay | Admitting: Internal Medicine

## 2012-12-19 ENCOUNTER — Telehealth: Payer: Self-pay | Admitting: Family Medicine

## 2012-12-19 NOTE — Telephone Encounter (Signed)
That is actually only a little over a trace of protein Yes, it probably is from the prostate infection

## 2012-12-19 NOTE — Telephone Encounter (Signed)
Pt's mother left vm concerned about the level of protein in his urine after seeing MyChart labs.  She wants to know if this has anything to do with his dx of prostatitis.

## 2012-12-20 NOTE — Telephone Encounter (Signed)
Left message on machine with results, advised mom to call if any questions 

## 2012-12-29 ENCOUNTER — Encounter: Payer: Self-pay | Admitting: Internal Medicine

## 2012-12-29 ENCOUNTER — Ambulatory Visit (INDEPENDENT_AMBULATORY_CARE_PROVIDER_SITE_OTHER): Payer: Self-pay | Admitting: Internal Medicine

## 2012-12-29 VITALS — BP 100/70 | HR 98 | Temp 98.1°F | Wt 131.0 lb

## 2012-12-29 DIAGNOSIS — N411 Chronic prostatitis: Secondary | ICD-10-CM

## 2012-12-29 MED ORDER — TAMSULOSIN HCL 0.4 MG PO CAPS: 0.4000 mg | ORAL_CAPSULE | Freq: Every day | ORAL | Status: DC

## 2012-12-29 NOTE — Assessment & Plan Note (Signed)
Finally some better now Not on embrel so concerning that he has had ongoing problems Will have him stay on the antibiotic for 2 months Restart and stay on tamsulosin If recurs, will set up with urologist

## 2012-12-29 NOTE — Progress Notes (Signed)
  Subjective:    Patient ID: Sean Walters, male    DOB: 1985-09-15, 27 y.o.   MRN: [id number]  HPI Here with dad  His mom was actually the one emailing Discussed that prostate cancer is not really a concern at his age  Has cut back on sodas Discussed stopping smoking  Is now feeling some better on the antibiotic Feels 85-90% better No dysuria now----but Kawabata have some stinging if he holds it No blood  Never completely got better from the December infection  Not on embrel since December Psoriasis has not been bad Doesn't think the enbrel helps the arthritis much though  Current Outpatient Prescriptions on File Prior to Visit  Medication Sig Dispense Refill  . sulfamethoxazole-trimethoprim (BACTRIM DS) 800-160 MG per tablet Take 1 tablet by mouth 2 (two) times daily.  42 tablet  2   No current facility-administered medications on file prior to visit.    No Known Allergies  Past Medical History  Diagnosis Date  . Psoriatic arthritis     Dr Gavin Potters  . Irritable bowel syndrome   . Other psoriasis   . Sacroiliitis     Past Surgical History  Procedure Laterality Date  . US echocardiography  07/31/02  . Esophagogastroduodenoscopy  [id number]  . Cystoscopy  [id number]    normal    Family History  Problem Relation Age of Onset  . Coronary artery disease Neg Hx   . Hypertension Neg Hx   . Diabetes Neg Hx     History   Social History  . Marital Status: Single    Spouse Name: N/A    Number of Children: N/A  . Years of Education: N/A   Occupational History  . Out of work--disabled due to arthritis    Social History Main Topics  . Smoking status: Current Every Day Smoker -- 1.00 packs/day for 2 years    Types: Cigarettes  . Smokeless tobacco: Never Used     Comment: gave 1-800-quit now  . Alcohol Use: No  . Drug Use: No  . Sexual Activity: Not on file   Other Topics Concern  . Not on file   Social History Narrative  . No narrative on file   Review of  Systems Appetite is okay but not great Weight is stable No nausea or vomiting    Objective:   Physical Exam  Constitutional: He appears well-developed and well-nourished. No distress.  Abdominal: Soft. He exhibits no distension and no mass. There is no tenderness. There is no rebound and no guarding.          Assessment & Plan:

## 2012-12-29 NOTE — Patient Instructions (Signed)
Please take the antibiotic for 2 months. Start and stay on the tamsulosin If your prostate symptoms worsen, let me know and I will set you up with a urologist

## 2013-03-02 ENCOUNTER — Other Ambulatory Visit: Payer: Self-pay

## 2013-10-16 ENCOUNTER — Encounter: Payer: Self-pay | Admitting: Family Medicine

## 2013-10-16 ENCOUNTER — Ambulatory Visit (INDEPENDENT_AMBULATORY_CARE_PROVIDER_SITE_OTHER): Payer: Self-pay | Admitting: Family Medicine

## 2013-10-16 ENCOUNTER — Telehealth: Payer: Self-pay | Admitting: Internal Medicine

## 2013-10-16 VITALS — BP 110/60 | HR 88 | Temp 98.7°F | Wt 130.8 lb

## 2013-10-16 DIAGNOSIS — L255 Unspecified contact dermatitis due to plants, except food: Secondary | ICD-10-CM

## 2013-10-16 DIAGNOSIS — L237 Allergic contact dermatitis due to plants, except food: Secondary | ICD-10-CM | POA: Insufficient documentation

## 2013-10-16 MED ORDER — TRIAMCINOLONE ACETONIDE 0.1 % EX CREA: 1.0000 "application " | TOPICAL_CREAM | Freq: Two times a day (BID) | CUTANEOUS | Status: DC

## 2013-10-16 MED ORDER — CLOBETASOL PROPIONATE 0.05 % EX CREA: 1.0000 "application " | TOPICAL_CREAM | Freq: Two times a day (BID) | CUTANEOUS | Status: DC

## 2013-10-16 NOTE — Telephone Encounter (Signed)
Patient was seen today. Patient was given a prescription for a steroid cream and was told to call be if it was too expensive.  Patient said the cream cost $275.  [facility]

## 2013-10-16 NOTE — Telephone Encounter (Signed)
New cream sent in, spoke with patient.

## 2013-10-16 NOTE — Telephone Encounter (Signed)
For Dr Reece AgarG to review

## 2013-10-16 NOTE — Progress Notes (Signed)
Pre visit review using our clinic review tool, if applicable. No additional management support is needed unless otherwise documented below in the visit note. 

## 2013-10-16 NOTE — Progress Notes (Signed)
   BP 110/60  Pulse 88  Temp(Src) 98.7 F (37.1 C) (Oral)  Wt 130 lb 12 oz (59.308 kg)   CC: skin rash  Subjective:    Patient ID: Sean Walters, male    DOB: Feb 10, 1986, 28 y.o.   MRN: [id number]  HPI: Sean BoucheJoshua M Mones is a 28 y.o. male presenting on 10/16/2013 for Poison Ivy   4d h/o pruritic skin rash - started after worked on back deck to remove deck. Priola have been poison ivy. Bilateral forearms.  Has been treating with calomine lotion which made rash itch worse.  No new lotions/ detergents, soaps or shampoo.  Relevant past medical, surgical, family and social history reviewed and updated as indicated.  Allergies and medications reviewed and updated. No current outpatient prescriptions on file prior to visit.   No current facility-administered medications on file prior to visit.    Review of Systems Per HPI unless specifically indicated above    Objective:    BP 110/60  Pulse 88  Temp(Src) 98.7 F (37.1 C) (Oral)  Wt 130 lb 12 oz (59.308 kg)  Physical Exam  Nursing note and vitals reviewed. Constitutional: He appears well-developed and well-nourished. No distress.  Skin: Skin is warm and dry. Rash noted. There is erythema.  Pruritic erythematous papular rash with small vesicles as well - some in linear distribution - on bilateral forearms       Assessment & Plan:   Problem List Items Addressed This Visit   Poison ivy dermatitis - Primary     Given isolated to forearms will treat with topical steroid cream (temovate). rec benadryl for night time itch (discussed sedation precautions). Update if not improving as expected. Advised to price out cream. Handout provided for patient information        Follow up plan: Return if symptoms worsen or fail to improve.

## 2013-10-16 NOTE — Assessment & Plan Note (Signed)
Given isolated to forearms will treat with topical steroid cream (temovate). rec benadryl for night time itch (discussed sedation precautions). Update if not improving as expected. Advised to price out cream. Handout provided for patient information

## 2013-10-16 NOTE — Telephone Encounter (Signed)
Seen by Dr. Reece AgarG, not Dr. Alphonsus SiasLetvak.

## 2013-10-16 NOTE — Patient Instructions (Signed)
Treat with temovate cream, benadryl for night time itching Poison [facility] Poison ivy is a inflammation of the skin (contact dermatitis) caused by touching the allergens on the leaves of the ivy plant following previous exposure to the plant. The rash usually appears 48 hours after exposure. The rash is usually bumps (papules) or blisters (vesicles) in a linear pattern. Depending on your own sensitivity, the rash Kueker simply cause redness and itching, or it Mcclenny also progress to blisters which Struckman break open. These must be well cared for to prevent secondary bacterial (germ) infection, followed by scarring. Keep any open areas dry, clean, dressed, and covered with an antibacterial ointment if needed. The eyes Fedorko also get puffy. The puffiness is worst in the morning and gets better as the day progresses. This dermatitis usually heals without scarring, within 2 to 3 weeks without treatment. HOME CARE INSTRUCTIONS  Thoroughly wash with soap and water as soon as you have been exposed to poison ivy. You have about one half hour to remove the plant resin before it will cause the rash. This washing will destroy the oil or antigen on the skin that is causing, or will cause, the rash. Be sure to wash under your fingernails as any plant resin there will continue to spread the rash. Do not rub skin vigorously when washing affected area. Poison ivy cannot spread if no oil from the plant remains on your body. A rash that has progressed to weeping sores will not spread the rash unless you have not washed thoroughly. It is also important to wash any clothes you have been wearing as these Mackiewicz carry active allergens. The rash will return if you wear the unwashed clothing, even several days later. Avoidance of the plant in the future is the best measure. Poison ivy plant can be recognized by the number of leaves. Generally, poison ivy has three leaves with flowering branches on a single stem. Diphenhydramine Dibella be purchased over the  counter and used as needed for itching. Do not drive with this medication if it makes you drowsy.Ask your caregiver about medication for children. SEEK MEDICAL CARE IF:  Open sores develop.  Redness spreads beyond area of rash.  You notice purulent (pus-like) discharge.  You have increased pain.  Other signs of infection develop (such as fever). Document Released: 04/10/2000 Document Revised: 07/06/2011 Document Reviewed: 02/27/2009 [facility] Patient Information 2015 [place]. This information is not intended to replace advice given to you by your health care provider. Make sure you discuss any questions you have with your health care provider.

## 2013-10-17 ENCOUNTER — Telehealth: Payer: Self-pay | Admitting: Internal Medicine

## 2013-10-17 NOTE — Telephone Encounter (Signed)
Relevant patient education mailed to patient.  

## 2014-07-25 ENCOUNTER — Ambulatory Visit (INDEPENDENT_AMBULATORY_CARE_PROVIDER_SITE_OTHER): Payer: Self-pay | Admitting: Family Medicine

## 2014-07-25 ENCOUNTER — Telehealth: Payer: Self-pay | Admitting: Internal Medicine

## 2014-07-25 ENCOUNTER — Encounter: Payer: Self-pay | Admitting: Family Medicine

## 2014-07-25 VITALS — BP 118/64 | HR 122 | Temp 97.7°F | Ht 69.0 in | Wt 128.5 lb

## 2014-07-25 DIAGNOSIS — R3915 Urgency of urination: Secondary | ICD-10-CM

## 2014-07-25 DIAGNOSIS — N411 Chronic prostatitis: Secondary | ICD-10-CM

## 2014-07-25 LAB — POCT URINALYSIS DIPSTICK
BILIRUBIN UA: NEGATIVE
Blood, UA: NEGATIVE
GLUCOSE UA: NEGATIVE
KETONES UA: NEGATIVE
Leukocytes, UA: NEGATIVE
NITRITE UA: NEGATIVE
Spec Grav, UA: >=1.03
Urobilinogen, UA: 0.2
pH, UA: 6

## 2014-07-25 MED ORDER — LEVOFLOXACIN 500 MG PO TABS
500.0000 mg | ORAL_TABLET | Freq: Every day | ORAL | Status: DC
Start: 2014-07-25 — End: 2014-11-02

## 2014-07-25 NOTE — Progress Notes (Signed)
   Dr. Karleen HampshireSpencer T. Jaydalee Bardwell, MD, CAQ Sports Medicine [facility] [address] [place], [address] Phone: [phone] Fax: [phone]  07/25/2014  Patient: Sean Walters, MRN: [id number], DOB: 1985-06-20, 29 y.o.  Primary Physician:  [name], MD  Chief Complaint: Urinary Urgency and Nausea  Subjective:   Sean Walters is a 29 y.o. very pleasant male patient who presents with the following:  Chronic prostatis. Pleasant gentleman who has been treated a few times for chronic prostatitis. Previously he has been treated with Septra. He did have some resolution of his symptoms at that time, but now he presents with pain in his perineal area and some in the hypogastric region. He occasionally has some urgency and nausea also.  Past Medical History, Surgical History, Social History, Family History, Problem List, Medications, and Allergies have been reviewed and updated if relevant.  ROS: GEN: Acute illness details above GI: Tolerating PO intake GU: maintaining adequate hydration and urination Pulm: No SOB Interactive and getting along well at home.  Otherwise, ROS is as per the HPI.   Objective:   BP 118/64 mmHg  Pulse 122  Temp(Src) 97.7 F (36.5 C) (Oral)  Ht 5\' 9"  (1.753 m)  Wt 128 lb 8 oz (58.287 kg)  BMI 18.97 kg/m2   GEN: WDWN, NAD, Non-toxic, Alert & Oriented x 3 HEENT: Atraumatic, Normocephalic.  Ears and Nose: No external deformity. EXTR: No clubbing/cyanosis/edema NEURO: Normal gait.  PSYCH: Normally interactive. Conversant. Not depressed or anxious appearing.  Calm demeanor.    Results for orders placed or performed in visit on 07/25/14  POCT urinalysis dipstick  Result Value Ref Range   Color, UA yellow    Clarity, UA clear    Glucose, UA negative    Bilirubin, UA negative    Ketones, UA negative    Spec Grav, UA >=1.030    Blood, UA negative    pH, UA 6.0    Protein, UA trace    Urobilinogen, UA 0.2    Nitrite, UA  negative    Leukocytes, UA Negative       Laboratory and Imaging Data:  Assessment and Plan:   Chronic prostatitis without hematuria  Urinary urgency - Plan: POCT urinalysis dipstick  Reviewed article from Up To Date, resisted case, I am going to treat this gentleman 3 months of antibiotics. Arranged for him to get cost-effective treatment with specific susceptibility goals.  Follow-up: No Follow-up on file.  New Prescriptions   LEVOFLOXACIN (LEVAQUIN) 500 MG TABLET    Take 1 tablet (500 mg total) by mouth daily.   Orders Placed This Encounter  Procedures  . POCT urinalysis dipstick    Signed,  Karleen HampshireSpencer T. Armya Westerhoff, MD   Patient's Medications  New Prescriptions   LEVOFLOXACIN (LEVAQUIN) 500 MG TABLET    Take 1 tablet (500 mg total) by mouth daily.  Previous Medications   DIMENHYDRINATE (DRAMAMINE) 50 MG TABLET    Take 50 mg by mouth as needed.   TRIAMCINOLONE CREAM (KENALOG) 0.1 %    Apply 1 application topically 2 (two) times daily. Apply to AA.  Modified Medications   No medications on file  Discontinued Medications   No medications on file

## 2014-07-25 NOTE — Telephone Encounter (Signed)
emmi emailed °

## 2014-07-25 NOTE — Progress Notes (Signed)
Pre visit review using our clinic review tool, if applicable. No additional management support is needed unless otherwise documented below in the visit note. 

## 2014-11-01 ENCOUNTER — Telehealth: Payer: Self-pay | Admitting: Internal Medicine

## 2014-11-01 NOTE — Telephone Encounter (Signed)
It sounds as if someone needs to try to work the patient in or he needs to go to urgent care.   (I have seen once historically and Dr. Alphonsus SiasLetvak is PCP)  I am going to get him involved, he Haymond not even have prostatitis which was his original question.   Dee: can you call the patient and help sort out the acuity

## 2014-11-01 NOTE — Telephone Encounter (Signed)
Patient's mom,Alesia,called.  She said patient needs to be seen by someone right away.  Patient is very weak,nausea,trouble urinating.  Please call patient back at cell phone number.

## 2014-11-01 NOTE — Telephone Encounter (Signed)
Spoke with patient and he thought he would just get a prescription and referral. I advised it's been since 2014 since he was last seen and would need OV, pt scheduled for tomorrow at 10:15am

## 2014-11-01 NOTE — Telephone Encounter (Signed)
Dr. Alphonsus SiasLetvak and I both spoke about it, and we think that getting a evaluation from Urology is a good idea.   It looks like he has been seen 6 times for prostatitis in the last few years.   We can set that up if he is ok with it.

## 2014-11-01 NOTE — Telephone Encounter (Signed)
Pt called stating he has taken all his antibiotic for prostate infection.  It is coming back and he wanted to know if you would call him in something else or does he need to come in.  [facility]

## 2014-11-01 NOTE — Telephone Encounter (Signed)
Okay to add on today at end of schedule

## 2014-11-02 ENCOUNTER — Ambulatory Visit (INDEPENDENT_AMBULATORY_CARE_PROVIDER_SITE_OTHER): Payer: Self-pay | Admitting: Internal Medicine

## 2014-11-02 ENCOUNTER — Encounter: Payer: Self-pay | Admitting: Internal Medicine

## 2014-11-02 VITALS — BP 100/80 | HR 125 | Temp 98.0°F | Wt 122.0 lb

## 2014-11-02 DIAGNOSIS — N411 Chronic prostatitis: Secondary | ICD-10-CM

## 2014-11-02 LAB — POCT URINALYSIS DIPSTICK
Bilirubin, UA: NEGATIVE
Glucose, UA: NEGATIVE
Ketones, UA: NEGATIVE
LEUKOCYTES UA: NEGATIVE
NITRITE UA: NEGATIVE
PH UA: 6.5
RBC UA: NEGATIVE
Spec Grav, UA: 1.02
UROBILINOGEN UA: NEGATIVE

## 2014-11-02 MED ORDER — TAMSULOSIN HCL 0.4 MG PO CAPS: 0.4000 mg | ORAL_CAPSULE | Freq: Every day | ORAL | Status: DC

## 2014-11-02 MED ORDER — CIPROFLOXACIN HCL 500 MG PO TABS: 500.0000 mg | ORAL_TABLET | Freq: Two times a day (BID) | ORAL | Status: DC

## 2014-11-02 NOTE — Assessment & Plan Note (Signed)
Had best symptom relief with levaquin but has never gone away Will give cipro (more focal Rx for prostate) Start tamsulosin Urology eval

## 2014-11-02 NOTE — Progress Notes (Signed)
Pre visit review using our clinic review tool, if applicable. No additional management support is needed unless otherwise documented below in the visit note. 

## 2014-11-02 NOTE — Progress Notes (Signed)
   Subjective:    Patient ID: Sean Walters, male    DOB: 02/07/1986, 29 y.o.   MRN: [id number]  HPI Here for ongoing prostate symptoms  Felt like he never had symptoms gone over the past 2 years  Reviewed past prostate problems Felt ~95% better after the Rx Then started with more urgency "sick feeling in the bottom of gut" Stinging at urethra and feels like he doesn't empty  No fever No hematuria [place] No hematospermia  Current Outpatient Prescriptions on File Prior to Visit  Medication Sig Dispense Refill  . dimenhyDRINATE (DRAMAMINE) 50 MG tablet Take 50 mg by mouth as needed.     No current facility-administered medications on file prior to visit.    No Known Allergies  Past Medical History  Diagnosis Date  . [facility]     [facility]  . Irritable bowel syndrome   . Other psoriasis   . Sacroiliitis     Past Surgical History  Procedure Laterality Date  . [place] echocardiography  07/31/02  . Esophagogastroduodenoscopy  [id number]  . Cystoscopy  [id number]    normal    Family History  Problem Relation Age of Onset  . Coronary artery disease Neg Hx   . Hypertension Neg Hx   . Diabetes Neg Hx     History   Social History  . Marital Status: Single    Spouse Name: N/A  . Number of Children: N/A  . Years of Education: N/A   Occupational History  . Out of work--disabled due to arthritis    Social History Main Topics  . Smoking status: Current Every Day Smoker -- 1.00 packs/day for 2 years    Types: Cigarettes  . Smokeless tobacco: Never Used     Comment: gave 1-800-quit now  . Alcohol Use: No  . Drug Use: No  . Sexual Activity: Not on file   Other Topics Concern  . Not on file   Social History Narrative   Review of Systems Weight stable Sleep is not great---uses dramamine    Objective:   Physical Exam  Constitutional: He appears well-developed and well-nourished. No distress.  Abdominal: Soft. Bowel sounds are normal. He exhibits no  distension. There is no tenderness. There is no rebound and no guarding.  Genitourinary:  Scrotum and urethra normal Symmetric moderately enlarged prostate Moderate tenderness          Assessment & Plan:

## 2014-11-06 ENCOUNTER — Ambulatory Visit (INDEPENDENT_AMBULATORY_CARE_PROVIDER_SITE_OTHER): Payer: Self-pay | Admitting: Urology

## 2014-11-06 VITALS — BP 102/68 | HR 105 | Ht 71.0 in | Wt 126.0 lb

## 2014-11-06 DIAGNOSIS — N3020 Other chronic cystitis without hematuria: Secondary | ICD-10-CM

## 2014-11-06 DIAGNOSIS — N411 Chronic prostatitis: Secondary | ICD-10-CM

## 2014-11-06 LAB — URINALYSIS, COMPLETE
Bilirubin, UA: NEGATIVE
Glucose, UA: NEGATIVE
LEUKOCYTES UA: NEGATIVE
NITRITE UA: NEGATIVE
Specific Gravity, UA: >1.03 — ABNORMAL HIGH (ref 1.005–1.030)
Urobilinogen, Ur: 1 mg/dL (ref 0.2–1.0)
pH, UA: 7 (ref 5.0–7.5)

## 2014-11-06 LAB — MICROSCOPIC EXAMINATION: BACTERIA UA: NONE SEEN

## 2014-11-06 LAB — BLADDER SCAN AMB NON-IMAGING: SCAN RESULT: 38

## 2014-11-06 NOTE — Progress Notes (Signed)
I have been asked to see the patient by Sean Walters. Sean Walters Walters, for evaluation and management of chronic prostatitis.  History of present illness: Sean Walters Walters is a 29 year old male who for the past 18-20 months has had intermittent suprapubic pressure, a constant sense of urinary urgency, and dysuria or burning at the tip of his urethra and the feeling of incomplete bladder emptying. The patient is been treated with Bactrim for several weeks, Levaquin for 3 months, and now is on ciprofloxacin. Each time he completes a course of antibody is he feels that the symptoms nearly resolved but don't completely stop. He also recently was started on tamsulosin, he is not sure whether this helps at this point or not. The patient denies any hematuria or incontinence. He does endorse intermittent urinary frequency and urgency. When he has a flare he can get up once an hour at night. Normally, he voids once a night. The patient does not have any previous history of urinary tract infection.  The patient has a history of psoriasis with associated arthritis.  His past history is otherwise unremarkable.  The patient relates that he drinks 6 or 7 sodas a day in the form of Sean Walters Walters. Sean Walters Walters. He occasionally will drink Gatorade as well. He rarely drinks any water. He does not eat spicy food.  Review of systems: A 12 point comprehensive review of systems was obtained and is negative unless otherwise stated in the history of present illness.  Patient Active Problem List   Diagnosis Date Noted  . Poison ivy dermatitis 10/16/2013  . Chronic prostatitis 04/05/2012  . Vasovagal syncope 02/22/2012  . IBS 11/26/2006  . TENSION HEADACHE 06/24/2006  . MIGRAINE, UNSPEC., Walters/O INTRACTABLE MIGRAINE 06/24/2006  . HYPERTENSION, BENIGN SYSTEMIC 06/24/2006  . SEBORRHEIC DERMATITIS, NOS 06/24/2006  . PSORIASIS 06/24/2006  . TACHYCARDIA 06/24/2006    Current Outpatient Prescriptions on File Prior to Visit  Medication Sig  Dispense Refill  . ciprofloxacin (CIPRO) 500 MG tablet Take 1 tablet (500 mg total) by mouth 2 (two) times daily. (Patient not taking: Reported on 11/06/2014) 84 tablet 0  . dimenhyDRINATE (DRAMAMINE) 50 MG tablet Take 50 mg by mouth as needed.    . tamsulosin (FLOMAX) 0.4 MG CAPS capsule Take 1 capsule (0.4 mg total) by mouth daily. (Patient not taking: Reported on 11/06/2014) 30 capsule 3   No current facility-administered medications on file prior to visit.    Past Medical History  Diagnosis Date  . Psoriatic arthritis     Sean Walters Walters  . Irritable bowel syndrome   . Other psoriasis   . Sacroiliitis   . Chronic prostatitis     Past Surgical History  Procedure Laterality Date  . [place] echocardiography  07/31/02  . Esophagogastroduodenoscopy  [id number]  . Cystoscopy  [id number]    normal    History  Substance Use Topics  . Smoking status: Current Every Day Smoker -- 1.00 packs/day for 2 years    Types: Cigarettes  . Smokeless tobacco: Never Used     Comment: gave 1-800-quit now  . Alcohol Use: No    Family History  Problem Relation Age of Onset  . Coronary artery disease Neg Hx   . Hypertension Neg Hx   . Diabetes Neg Hx   . Kidney cancer Neg Hx   . Prostate cancer Neg Hx     PE: Filed Vitals:   11/06/14 1528  BP: 102/68  Pulse: 105  Height: 5\' 11"  (1.803 m)  Weight: 126 lb (57.153  kg)   Patient appears to be in no acute distress  patient is alert and oriented x3 Atraumatic normocephalic head No cervical or supraclavicular lymphadenopathy appreciated No increased work of breathing, no audible wheezes/rhonchi Regular sinus rhythm/rate Abdomen is soft, nontender, nondistended, no CVA or suprapubic tenderness Lower extremities are symmetric without appreciable edema Grossly neurologically intact No identifiable skin lesions  No results for input(s): WBC, HGB, HCT in the last 72 hours. No results for input(s): NA, K, CL, CO2, GLUCOSE, BUN, CREATININE, CALCIUM in  the last 72 hours. No results for input(s): LABPT, INR in the last 72 hours. No results for input(s): LABURIN in the last 72 hours. Results for orders placed or performed in visit on 11/06/14  Microscopic Examination     Status: Abnormal   Collection Time: 11/06/14  3:35 PM  Result Value Ref Range Status   WBC, UA 0-5 0 -  5 /hpf Final   RBC, UA 3-10 (A) 0 -  2 /hpf Final   Epithelial Cells (non renal) 0-10 0 - 10 /hpf Final   Mucus, UA Present (A) Not Estab. Final   Bacteria, UA None seen None seen/Few Final    Imaging: none  Imp/Recommendations:  the patient has what appears to be inflammation within his bladder, although today his urinalysis is normal. Symptoms would suggest that he has bladder irritation and is exacerbated by bladder irritants including the excessive amount of sodas that he drinks during the day. Clearly, this is not an infection as it is not responded well to the infection at all. We discussed treatment options at this point and I think it is reasonable to try pelvic floor physical therapy which Sean Walters Walters help relax his pelvic floor and reduces inflammation. I also recommended that he consider taking ibuprofen 800 mg 3 times daily for 2 weeks. In addition, I encouraged the patient to make some dietary changes to eliminate his sodium intake and increase his water intake. I think the patient should continue taking the tamsulosin. I told him he should complete his ciprofloxacin prescription that he has currently. I will plan to follow-up with the patient in 6-8 weeks. If the patient's symptoms are resolved, he does not need to follow-up.    Sean Walters Walters

## 2014-12-17 ENCOUNTER — Ambulatory Visit: Payer: Self-pay

## 2014-12-17 ENCOUNTER — Encounter: Payer: Self-pay | Admitting: Urology

## 2015-01-30 ENCOUNTER — Encounter: Payer: Self-pay | Admitting: Internal Medicine

## 2015-01-30 ENCOUNTER — Ambulatory Visit (INDEPENDENT_AMBULATORY_CARE_PROVIDER_SITE_OTHER): Payer: Self-pay | Admitting: Internal Medicine

## 2015-01-30 ENCOUNTER — Ambulatory Visit: Payer: Self-pay | Admitting: Obstetrics and Gynecology

## 2015-01-30 VITALS — BP 100/60 | HR 80 | Temp 97.7°F | Wt 129.0 lb

## 2015-01-30 DIAGNOSIS — R3915 Urgency of urination: Secondary | ICD-10-CM

## 2015-01-30 DIAGNOSIS — R300 Dysuria: Secondary | ICD-10-CM

## 2015-01-30 LAB — POCT URINALYSIS DIPSTICK
Bilirubin, UA: NEGATIVE
GLUCOSE UA: NEGATIVE
Ketones, UA: NEGATIVE
LEUKOCYTES UA: NEGATIVE
NITRITE UA: NEGATIVE
RBC UA: NEGATIVE
Spec Grav, UA: 1.02
UROBILINOGEN UA: NEGATIVE
pH, UA: 7

## 2015-01-30 MED ORDER — CIPROFLOXACIN HCL 500 MG PO TABS: 500.0000 mg | ORAL_TABLET | Freq: Two times a day (BID) | ORAL | Status: DC

## 2015-01-30 NOTE — Assessment & Plan Note (Signed)
Reviewed Dr Herrick's evaluation From resolution off the sodas--and recurrence back on-- it seems clear that it is acting as a bladder irritant Will give 10 days of antibiotic again since it has seemed to help

## 2015-01-30 NOTE — Progress Notes (Signed)
   Subjective:    Patient ID: Sean Walters, male    DOB: 1985/08/24, 29 y.o.   MRN: [id number]  HPI Having urinary symptoms again Did stop soda after Dr Herrick's visit but symptoms got better and he is back to using 4 or more sodas a day  Done with antibiotic and tamsulosin (didn't refill this)  Started again with symptoms 2-3 days ago Worse last night No fever but gets hot Some shakes in evening--but usually can cool himself off Slight stinging dysuria Stream slightly less thick Has the urgency but not always much comes out  No current outpatient prescriptions on file prior to visit.   No current facility-administered medications on file prior to visit.    No Known Allergies  Past Medical History  Diagnosis Date  . Psoriatic arthritis (HCC)     Dr Gavin Potters  . Irritable bowel syndrome   . Other psoriasis   . Sacroiliitis (HCC)   . Chronic prostatitis     Past Surgical History  Procedure Laterality Date  . US echocardiography  07/31/02  . Esophagogastroduodenoscopy  [id number]  . Cystoscopy  [id number]    normal    Family History  Problem Relation Age of Onset  . Coronary artery disease Neg Hx   . Hypertension Neg Hx   . Diabetes Neg Hx   . Kidney cancer Neg Hx   . Prostate cancer Neg Hx     Social History   Social History  . Marital Status: Single    Spouse Name: N/A  . Number of Children: N/A  . Years of Education: N/A   Occupational History  . Out of work--disabled due to arthritis    Social History Main Topics  . Smoking status: Current Every Day Smoker -- 1.00 packs/day for 2 years    Types: Cigarettes  . Smokeless tobacco: Never Used     Comment: gave 1-800-quit now  . Alcohol Use: No  . Drug Use: No  . Sexual Activity: Not on file   Other Topics Concern  . Not on file   Social History Narrative   Caffeine use: 6+   Review of Systems  Nausea but no vomiting or diarrhea Appetite is off some No rash     Objective:   Physical Exam    Constitutional: He appears well-developed and well-nourished. No distress.  Abdominal: Soft. He exhibits no distension. There is no tenderness. There is no rebound and no guarding.          Assessment & Plan:

## 2015-01-30 NOTE — Progress Notes (Signed)
Pre visit review using our clinic review tool, if applicable. No additional management support is needed unless otherwise documented below in the visit note. 

## 2015-01-31 ENCOUNTER — Encounter: Payer: Self-pay | Admitting: Internal Medicine

## 2017-10-05 ENCOUNTER — Encounter: Payer: Self-pay | Admitting: Family Medicine

## 2017-10-05 ENCOUNTER — Ambulatory Visit (INDEPENDENT_AMBULATORY_CARE_PROVIDER_SITE_OTHER): Payer: Self-pay | Admitting: Family Medicine

## 2017-10-05 VITALS — BP 118/68 | HR 120 | Temp 98.4°F | Ht 69.0 in | Wt 124.0 lb

## 2017-10-05 DIAGNOSIS — R000 Tachycardia, unspecified: Secondary | ICD-10-CM

## 2017-10-05 DIAGNOSIS — L237 Allergic contact dermatitis due to plants, except food: Secondary | ICD-10-CM

## 2017-10-05 MED ORDER — TRIAMCINOLONE ACETONIDE 0.1 % EX CREA: 1.0000 "application " | TOPICAL_CREAM | Freq: Two times a day (BID) | CUTANEOUS | 0 refills | Status: DC

## 2017-10-05 MED ORDER — PREDNISONE 10 MG PO TABS: ORAL_TABLET | ORAL | 0 refills | Status: DC

## 2017-10-05 NOTE — Progress Notes (Signed)
BP 118/68 (BP Location: Left Arm, Patient Position: Sitting, Cuff Size: Normal)   Pulse (!) 120   Temp 98.4 F (36.9 C) (Oral)   Ht 5\' 9"  (1.753 m)   Wt 124 lb (56.2 kg)   SpO2 97%   BMI 18.31 kg/m    CC: skin rash Subjective:    Patient ID: Sean Walters, male    DOB: December 14, 1985, 32 y.o.   MRN: [id number]  HPI: Sean Walters is a 32 y.o. male [facility]05/[facility]ash located on bilateral UEs. Noticed on 10/02/17.)   Exposed to poison oak on Saturday - working in grandmother's yard removing brush. Present bilateral forearms.    Treating with calomine lotion without benefit.   No fevers/chills, no other rash.  No new lotions/detergents/soaps or shampoos.   Tachycardia - denies caffeine use.  Thinks he's dehydrated. Daily MJ use to treat his arthritis pain - doesn't think this is laced. Denies chest pain, palpitations, dyspnea, dizziness or other symptoms. Overall feels well.   Relevant past medical, surgical, family and social history reviewed and updated as indicated. Interim medical history since our last visit reviewed. Allergies and medications reviewed and updated. Outpatient Medications Prior to Visit  Medication Sig Dispense Refill  . ciprofloxacin (CIPRO) 500 MG tablet Take 1 tablet (500 mg total) by mouth 2 (two) times daily. 20 tablet 0   No facility-administered medications prior to visit.      Per HPI unless specifically indicated in ROS section below Review of Systems     Objective:    BP 118/68 (BP Location: Left Arm, Patient Position: Sitting, Cuff Size: Normal)   Pulse (!) 120   Temp 98.4 F (36.9 C) (Oral)   Ht 5\' 9"  (1.753 m)   Wt 124 lb (56.2 kg)   SpO2 97%   BMI 18.31 kg/m   Wt Readings from Last 3 Encounters:  10/05/17 124 lb (56.2 kg)  01/30/15 129 lb (58.5 kg)  11/06/14 126 lb (57.2 kg)    Physical Exam  Constitutional: He appears well-developed and well-nourished. No distress.  HENT:  Mouth/Throat: No oropharyngeal  exudate.  Dry mucous membranes  Skin: Skin is warm and dry. Rash noted.  Linear erythematous pruritis papular rash on bilateral forearms  Nursing note and vitals reviewed.     Assessment & Plan:   Problem List Items Addressed This Visit    TACHYCARDIA    Endorses chronic issue with this. Dehydrated today. Denies Encouraged go home and drink plenty of water, start monitoring HR at home - discussed his [employer] has S health app that will check pulse for him. Update us if persistently elevated. Pt agrees with plan. Encouraged decreased MJ use.       Poison ivy dermatitis - Primary    Limited to upper extremities but seems to be spreading. Rx TCI cream, prednisone 10d taper. Handout provided. Steroid precautions reviewed.           Meds ordered this encounter  Medications  . triamcinolone cream (KENALOG) 0.1 %    Sig: Apply 1 application topically 2 (two) times daily. Apply to AA.    Dispense:  45 g    Refill:  0  . predniSONE (DELTASONE) 10 MG tablet    Sig: Take four tablets for 5 days followed by two tablets for 5 days    Dispense:  30 tablet    Refill:  0   No orders of the defined types were placed in this encounter.  Follow up plan: Return if symptoms worsen or fail to improve.  Ria Bush, MD

## 2017-10-05 NOTE — Patient Instructions (Signed)
Poison ivy - treat with steroid cream and oral prednisone course for 10 days.  Increase water intake - you're likely dehydrated today.   Poison Ivy Dermatitis Poison ivy dermatitis is inflammation of the skin that is caused by the allergens on the leaves of the poison ivy plant. The skin reaction often involves redness, swelling, blisters, and extreme itching. What are the causes? This condition is caused by a specific chemical (urushiol) found in the sap of the poison ivy plant. This chemical is sticky and can be easily spread to people, animals, and objects. You can get poison ivy dermatitis by:  Having direct contact with a poison ivy plant.  Touching animals, other people, or objects that have come in contact with poison ivy and have the chemical on them.  What increases the risk? This condition is more likely to develop in:  People who are outdoors often.  People who go outdoors without wearing protective clothing, such as closed shoes, long pants, and a long-sleeved shirt.  What are the signs or symptoms? Symptoms of this condition include:  Redness and itching.  A rash that often includes bumps and blisters. The rash usually appears 48 hours after exposure.  Swelling. This Bechtold occur if the reaction is more severe.  Symptoms usually last for 1-2 weeks. However, the first time you develop this condition, symptoms Yordy last 3-4 weeks. How is this diagnosed? This condition Kirstein be diagnosed based on your symptoms and a physical exam. Your health care provider Menzie also ask you about any recent outdoor activity. How is this treated? Treatment for this condition will vary depending on how severe it is. Treatment Wavra include:  Hydrocortisone creams or calamine lotions to relieve itching.  Oatmeal baths to soothe the skin.  Over-the-counter antihistamine tablets.  Oral steroid medicine for more severe outbreaks.  Follow these instructions at home:  Take or apply  over-the-counter and prescription medicines only as told by your health care provider.  Wash exposed skin as soon as possible with soap and cold water.  Use hydrocortisone creams or calamine lotion as needed to soothe the skin and relieve itching.  Take oatmeal baths as needed. Use colloidal oatmeal. You can get this at your local pharmacy or grocery store. Follow the instructions on the packaging.  Do not scratch or rub your skin.  While you have the rash, wash clothes right after you wear them. How is this prevented?  Learn to identify the poison ivy plant and avoid contact with the plant. This plant can be recognized by the number of leaves. Generally, poison ivy has three leaves with flowering branches on a single stem. The leaves are typically glossy, and they have jagged edges that come to a point at the front.  If you have been exposed to poison ivy, thoroughly wash with soap and water right away. You have about 30 minutes to remove the plant resin before it will cause the rash. Be sure to wash under your fingernails because any plant resin there will continue to spread the rash.  When hiking or camping, wear clothes that will help you to avoid exposure on the skin. This includes long pants, a long-sleeved shirt, tall socks, and hiking boots. You can also apply preventive lotion to your skin to help limit exposure.  If you suspect that your clothes or outdoor gear came in contact with poison ivy, rinse them off outside with a garden hose before you bring them inside your house. Contact a health care provider  if:  You have open sores in the rash area.  You have more redness, swelling, or pain in the affected area.  You have redness that spreads beyond the rash area.  You have fluid, blood, or pus coming from the affected area.  You have a fever.  You have a rash over a large area of your body.  You have a rash on your eyes, mouth, or genitals.  Your rash does not improve  after a few days. Get help right away if:  Your face swells or your eyes swell shut.  You have trouble breathing.  You have trouble swallowing. This information is not intended to replace advice given to you by your health care provider. Make sure you discuss any questions you have with your health care provider. Document Released: 04/10/2000 Document Revised: 09/19/2015 Document Reviewed: 09/19/2014 Elsevier Interactive Patient Education  [employer]2018 [employer]

## 2017-10-05 NOTE — Assessment & Plan Note (Addendum)
Limited to upper extremities but seems to be spreading. Rx TCI cream, prednisone 10d taper. Handout provided. Steroid precautions reviewed.

## 2017-10-05 NOTE — Assessment & Plan Note (Signed)
Endorses chronic issue with this. Dehydrated today. Denies Encouraged go home and drink plenty of water, start monitoring HR at home - discussed his [employer] has S health app that will check pulse for him. Update us if persistently elevated. Pt agrees with plan. Encouraged decreased MJ use.

## 2018-01-24 ENCOUNTER — Ambulatory Visit (INDEPENDENT_AMBULATORY_CARE_PROVIDER_SITE_OTHER): Payer: Self-pay | Admitting: Internal Medicine

## 2018-01-24 ENCOUNTER — Encounter: Payer: Self-pay | Admitting: Internal Medicine

## 2018-01-24 VITALS — BP 120/66 | HR 123 | Temp 99.5°F | Ht 69.0 in | Wt 125.0 lb

## 2018-01-24 DIAGNOSIS — J019 Acute sinusitis, unspecified: Secondary | ICD-10-CM | POA: Insufficient documentation

## 2018-01-24 MED ORDER — AMOXICILLIN 500 MG PO TABS: 1000.0000 mg | ORAL_TABLET | Freq: Two times a day (BID) | ORAL | 0 refills | Status: AC

## 2018-01-24 MED ORDER — BENZONATATE 200 MG PO CAPS: 200.0000 mg | ORAL_CAPSULE | Freq: Three times a day (TID) | ORAL | 0 refills | Status: DC | PRN

## 2018-01-24 NOTE — Assessment & Plan Note (Signed)
With secondary spread to left ear Will treat with amoxil Tessalon for cough Continue the ibuprofen Urged him to stop smoking

## 2018-01-24 NOTE — Progress Notes (Signed)
Subjective:    Patient ID: Sean Walters, male    DOB: 1985-10-22, 32 y.o.   MRN: [id number]  HPI Here due to respiratory symptoms  Has been sick for about 10 days Sore throat---phlegm coming up Burning in chest is better--but still burning in throat Some intermittent fever Feels drained--no energy Lots of cough---bringing up sputum (and swallows) Congested head--with yellow nasal drainage No clear post nasal drip More cough in day than night No SOB Some hot feelings at night  Using tylenol cold and flu--no clear help and stopped it Using vitamins Tried robitussin--no help Ibuprofen is helping fever  Current Outpatient Medications on File Prior to Visit  Medication Sig Dispense Refill  . triamcinolone cream (KENALOG) 0.1 % Apply 1 application topically 2 (two) times daily. Apply to AA. 45 g 0   No current facility-administered medications on file prior to visit.     No Known Allergies  Past Medical History:  Diagnosis Date  . Chronic prostatitis   . Irritable bowel syndrome   . Other psoriasis   . Psoriatic arthritis (HCC)    Dr Gavin Potters  . Sacroiliitis [facility])     Past Surgical History:  Procedure Laterality Date  . CYSTOSCOPY  [id number]   normal  . ESOPHAGOGASTRODUODENOSCOPY  [id number]  . US ECHOCARDIOGRAPHY  07/31/02    Family History  Problem Relation Age of Onset  . Coronary artery disease Neg Hx   . Hypertension Neg Hx   . Diabetes Neg Hx   . Kidney cancer Neg Hx   . Prostate cancer Neg Hx     Social History   Socioeconomic History  . Marital status: Single    Spouse name: Not on file  . Number of children: Not on file  . Years of education: Not on file  . Highest education level: Not on file  Occupational History  . Occupation: Out of work--disabled due to arthritis  Social Needs  . Financial resource strain: Not on file  . Food insecurity:    Worry: Not on file    Inability: Not on file  . Transportation needs:    Medical: Not on file      Non-medical: Not on file  Tobacco Use  . Smoking status: Current Every Day Smoker    Packs/day: 1.00    Years: 2.00    Pack years: 2.00    Types: Cigarettes  . Smokeless tobacco: Never Used  . Tobacco comment: gave 1-800-quit now  Substance and Sexual Activity  . Alcohol use: No  . Drug use: No  . Sexual activity: Not on file  Lifestyle  . Physical activity:    Days per week: Not on file    Minutes per session: Not on file  . Stress: Not on file  Relationships  . Social connections:    Talks on phone: Not on file    Gets together: Not on file    Attends religious service: Not on file    Active member of club or organization: Not on file    Attends meetings of clubs or organizations: Not on file    Relationship status: Not on file  . Intimate partner violence:    Fear of current or ex partner: Not on file    Emotionally abused: Not on file    Physically abused: Not on file    Forced sexual activity: Not on file  Other Topics Concern  . Not on file  Social History Narrative  . Not on file  Review of Systems Has cut back on cigarettes--- discussed cessation No vomiting  Soft stools but no diarrhea Dad is also sick Appetite down but eating okay No new rash Just started with ear pain on left today    Objective:   Physical Exam  Constitutional: No distress.  Mildly ill appearing--but NAD  HENT:  Mouth/Throat: Oropharynx is clear and moist. No oropharyngeal exudate.  No sinus tenderness Left TM red laterally and dull Right TM normal Moderate nasal inflammation  Neck: No thyromegaly present.  Respiratory: No respiratory distress. He has no wheezes. He has no rales.  Frequent cough  Lymphadenopathy:    He has no cervical adenopathy.           Assessment & Plan:

## 2018-06-09 ENCOUNTER — Emergency Department: Payer: Self-pay

## 2018-06-09 ENCOUNTER — Telehealth: Payer: Self-pay

## 2018-06-09 ENCOUNTER — Encounter: Payer: Self-pay | Admitting: Emergency Medicine

## 2018-06-09 ENCOUNTER — Emergency Department
Admission: EM | Admit: 2018-06-09 | Discharge: 2018-06-09 | Disposition: A | Discharge status: Home / Self Care | Payer: Self-pay | Attending: Emergency Medicine | Admitting: Emergency Medicine

## 2018-06-09 DIAGNOSIS — R0989 Other specified symptoms and signs involving the circulatory and respiratory systems: Secondary | ICD-10-CM | POA: Insufficient documentation

## 2018-06-09 DIAGNOSIS — I10 Essential (primary) hypertension: Secondary | ICD-10-CM | POA: Insufficient documentation

## 2018-06-09 DIAGNOSIS — F17210 Nicotine dependence, cigarettes, uncomplicated: Secondary | ICD-10-CM | POA: Insufficient documentation

## 2018-06-09 MED ORDER — ALUM & MAG HYDROXIDE-SIMETH 200-200-20 MG/5ML PO SUSP
30.0000 mL | Freq: Once | ORAL | Status: AC
  Administered 2018-06-09: 30 mL via ORAL
  Filled 2018-06-09: qty 30

## 2018-06-09 MED ORDER — GI COCKTAIL ~~LOC~~: ORAL | 0 refills | Status: DC

## 2018-06-09 MED ORDER — LIDOCAINE VISCOUS HCL 2 % MT SOLN
15.0000 mL | Freq: Once | OROMUCOSAL | Status: AC
  Administered 2018-06-09: 15 mL via ORAL
  Filled 2018-06-09: qty 15

## 2018-06-09 NOTE — Telephone Encounter (Signed)
Seen in ER CT showed no retained foreign body ---so sent home. CT did show incidental COPD

## 2018-06-09 NOTE — ED Provider Notes (Signed)
[facility] Emergency Department Provider Note  ____________________________________________   First MD Initiated Contact with Patient 06/09/18 [phone]     (approximate)  I have reviewed the triage vital signs and the nursing notes.   HISTORY  Chief Complaint Foreign Body  HPI Sean Walters is a 33 y.o. male presents to the ED with complaint of a fishbone stuck in his throat for 2 days.  Patient states that he was eating fish when he felt a scraping sensation.  He is tried multiple home remedies including peanut butter and bread without any relief of his symptoms.  Patient denies any difficulty breathing or swallowing.  He is continued to eat normal meals and drink fluids.  He rates his pain as 2 out of 10.    Past Medical History:  Diagnosis Date  . Chronic prostatitis   . Irritable bowel syndrome   . Other psoriasis   . Psoriatic arthritis (HCC)    [facility]  . Sacroiliitis [facility](HCC)     Patient Active Problem List   Diagnosis Date Noted  . Acute non-recurrent sinusitis 01/24/2018  . Urinary urgency 01/30/2015  . Poison ivy dermatitis 10/16/2013  . Chronic prostatitis 04/05/2012  . Vasovagal syncope 02/22/2012  . IBS 11/26/2006  . TENSION HEADACHE 06/24/2006  . MIGRAINE, UNSPEC., W/O INTRACTABLE MIGRAINE 06/24/2006  . HYPERTENSION, BENIGN SYSTEMIC 06/24/2006  . SEBORRHEIC DERMATITIS, NOS 06/24/2006  . PSORIASIS 06/24/2006  . TACHYCARDIA 06/24/2006    Past Surgical History:  Procedure Laterality Date  . CYSTOSCOPY  [id number]   normal  . ESOPHAGOGASTRODUODENOSCOPY  [id number]  . US ECHOCARDIOGRAPHY  07/31/02    Prior to Admission medications   Medication Sig Start Date End Date Taking? Authorizing Provider  Alum & Mag Hydroxide-Simeth (GI COCKTAIL) SUSP suspension Alum & mag hydroxide-simeth  and viscous lidocaine 1:1 10 ml ac and hs 06/09/18   Tommi RumpsSummers, Meghanne Pletz L, PA-C    Allergies Patient has no known allergies.  Family History  Problem  Relation Age of Onset  . Coronary artery disease Neg Hx   . Hypertension Neg Hx   . Diabetes Neg Hx   . Kidney cancer Neg Hx   . Prostate cancer Neg Hx     Social History Social History   Tobacco Use  . Smoking status: Current Every Day Smoker    Packs/day: 1.00    Years: 2.00    Pack years: 2.00    Types: Cigarettes  . Smokeless tobacco: Never Used  . Tobacco comment: gave 1-800-quit now  Substance Use Topics  . Alcohol use: No  . Drug use: No    Review of Systems Constitutional: No fever/chills Eyes: No visual changes. ENT: Positive throat foreign body sensation. Cardiovascular: Denies chest pain. Respiratory: Denies shortness of breath. Gastrointestinal: No abdominal pain.  No nausea, no vomiting.  Musculoskeletal: Negative for muscle skeletal pain. Skin: Negative for rash. Neurological: Negative for headaches ___________________________________________   PHYSICAL EXAM:  VITAL SIGNS: ED Triage Vitals  Enc Vitals Group     BP 06/09/18 0852 122/81     Pulse Rate 06/09/18 0852 (!) 113     Resp 06/09/18 0852 15     Temp 06/09/18 0852 98.4 F (36.9 C)     Temp Source 06/09/18 0852 Oral     SpO2 06/09/18 0852 99 %     Weight 06/09/18 0849 128 lb (58.1 kg)     Height 06/09/18 0849 5\' 10"  (1.778 m)     Head Circumference --  Peak Flow --      Pain Score 06/09/18 0848 2     Pain Loc --      Pain Edu? --      Excl. in GC? --    Constitutional: Alert and oriented. Well appearing and in no acute distress.  Able to talk in complete sentences without any difficulty. Eyes: Conjunctivae are normal.  Head: Atraumatic. Nose: No congestion/rhinnorhea. Mouth/Throat: Mucous membranes are moist.  Oropharynx non-erythematous.  No drooling, no difficulty swallowing. Neck: No stridor.   Cardiovascular: Normal rate, regular rhythm. Grossly normal heart sounds.  Good peripheral circulation. Respiratory: Normal respiratory effort.  No retractions. Lungs  CTAB. Musculoskeletal: Moves upper and lower extremities without any difficulty.  Normal gait was noted. Neurologic:  Normal speech and language. No gross focal neurologic deficits are appreciated. No gait instability. Skin:  Skin is warm, dry and intact. No rash noted. Psychiatric: Mood and affect are normal. Speech and behavior are normal.  ____________________________________________   LABS (all labs ordered are listed, but only abnormal results are displayed)  Labs Reviewed - No data to display  RADIOLOGY  Official radiology report(s): Dg Neck Soft Tissue  Result Date: 06/09/2018 CLINICAL DATA:  Fish bone stuck in throat since Tuesday. EXAM: NECK SOFT TISSUES - 1+ VIEW COMPARISON:  None. FINDINGS: Accounting for ossification of the laryngeal cartilage there is no opaque foreign body seen. No prevertebral thickening or airway narrowing. IMPRESSION: Negative. Electronically Signed   By: Marnee Spring M.D.   On: 06/09/2018 09:32   Ct Soft Tissue Neck Wo Contrast  Result Date: 06/09/2018 CLINICAL DATA:  Fish bone stuck in throat. EXAM: CT NECK WITHOUT CONTRAST TECHNIQUE: Multidetector CT imaging of the neck was performed following the standard protocol without intravenous contrast. COMPARISON:  Radiography same day FINDINGS: Pharynx and larynx: No mucosal or submucosal lesion. No evidence of calcified or hyperdense foreign object. No sign of soft tissue inflammation or perforation. Tiny tonsillar calcification on the right is commonly seen. Salivary glands: Normal Thyroid: Normal Lymph nodes: No enlarged or low-density nodes on either side of the neck. Vascular: Normal Limited intracranial: Normal Visualized orbits: Normal Mastoids and visualized paranasal sinuses: Clear Skeleton: Normal Upper chest: Markedly premature emphysematous disease. This could possibly be used as motivation for behavioral change. Other: None IMPRESSION: No sign of fishbone or other foreign object. No abnormality seen  to explain the clinical symptoms. Emphysema, markedly abnormal for age. Electronically Signed   By: Paulina Fusi M.D.   On: 06/09/2018 10:49    ____________________________________________   PROCEDURES  Procedure(s) performed: None  Procedures  Critical Care performed: No  ____________________________________________   INITIAL IMPRESSION / ASSESSMENT AND PLAN / ED COURSE  As part of my medical decision making, I reviewed the following data within the electronic MEDICAL RECORD NUMBER Notes from prior ED visits and [place] Controlled Substance Database  Patient presents to the ED with complaint of sensation of a fishbone stuck in his throat.  Patient reports that this happened 2 days ago and he has been trying home remedies without any success.  He still feels a sensation but denies any difficulty swallowing, breathing or talking.  Patient has continued to eat and drink without any difficulties.  Soft tissue neck and CT scan did not show a fishbone.  Patient was given a GI cocktail while in the ED which gave him some relief of his symptoms.  Patient was given a prescription to continue and instructions to follow-up with [facility] on Monday if he  continues to have symptoms.  He also was told to return to the emergency department if any severe worsening of his symptoms.  We also discussed the emphysema that was noted as an incidental finding on his CT report.  ____________________________________________   FINAL CLINICAL IMPRESSION(S) / ED DIAGNOSES  Final diagnoses:  Foreign body sensation in throat     ED Discharge Orders         Ordered    Alum & Mag Hydroxide-Simeth (GI COCKTAIL) SUSP suspension     06/09/18 1131           Note:  This document was prepared using Dragon voice recognition software and [place] include unintentional dictation errors.    Tommi Rumps, PA-C 06/09/18 1135    Nita Sickle, MD 06/12/18 1410

## 2018-06-09 NOTE — Telephone Encounter (Signed)
Pt has fish bone stuck in throat; pt can swallow and breathe OK but pt can feel the fish bone in his throat; pt has tried eating pieces of bread and bread with peanut butter with no success to get the bone to move. I spoke with Dr Para March and he agreed pt should go to ED for eval and assistance. Pt voiced understanding. FYI to Dr Alphonsus Sias.

## 2018-06-09 NOTE — ED Triage Notes (Signed)
Pt reports has a fishbone stuck in his throat. Pt reports has been there since Tuesday and he has tried every home remedy but nothing is working. Pt called MD and was advised to come to the ED.

## 2018-06-09 NOTE — ED Notes (Signed)
ED Provider at bedside. 

## 2018-06-09 NOTE — Discharge Instructions (Addendum)
You will need to follow-up with [facility] if any continued problems.  Continue to drink fluids.  Decrease smoking.  Use GI cocktail for discomfort.  If not improving Monday morning call the [facility] to let them know that you were referred by the emergency department.

## 2018-06-09 NOTE — ED Notes (Signed)
Pt with no resp distress. Able to eat and drink. LS clear.

## 2018-06-10 NOTE — Telephone Encounter (Signed)
Spoke to pt. He said he still feels like something is stuck. He is currently working on scheduling an appt with ENT. Advised him to let us know if he needed help with anything.

## 2018-06-20 ENCOUNTER — Other Ambulatory Visit: Payer: Self-pay | Admitting: Otolaryngology

## 2018-06-21 NOTE — Pre-Procedure Instructions (Signed)
Sean Walters  06/21/2018      [facility] - [place], [place] - [id number], [address] [place] [address] Phone: [phone] Fax: [phone]  [id number] - [place], [place] - [address] [place] [address] Phone: [phone] Fax: [phone]    Your procedure is scheduled on June 23, 2018.  Report to [facility], Entrance "A" at 530 AM.  Call this number if you have problems the morning of surgery:  [phone]   Remember:  Do not eat or drink after midnight, tonight 06/22/2018    Take these medicines the morning of surgery with A SIP OF WATER -none  Beginning now, STOP taking any Aspirin (unless otherwise instructed by your surgeon), Aleve, Naproxen, Ibuprofen, Motrin, Advil, Goody's, BC's, all herbal medications, fish oil, and all vitamins     Do not wear jewelry  Do not wear lotions, powders, or colognes, or deodorant.             Men Dress shave face and neck.  Do not bring valuables to the hospital.  [facility] is not responsible for any belongings or valuables.  Contacts, dentures or bridgework [place] not be worn into surgery.  Leave your suitcase in the car.  After surgery it [place] be brought to your room.  For patients admitted to the hospital, discharge time will be determined by your treatment team.  Patients discharged the day of surgery will not be allowed to drive home.    [facility]- Preparing For Surgery  Before surgery, you can play an important role. Because skin is not sterile, your skin needs to be as free of germs as possible. You can reduce the number of germs on your skin by washing with CHG (chlorahexidine gluconate) Soap before surgery.  CHG is an antiseptic cleaner which kills germs and bonds with the skin to continue killing germs even after washing.    Oral Hygiene is also important to reduce your risk of infection.  Remember - BRUSH YOUR TEETH THE MORNING OF SURGERY  WITH YOUR REGULAR TOOTHPASTE  Please do not use if you have an allergy to CHG or antibacterial soaps. If your skin becomes reddened/irritated stop using the CHG.  Do not shave (including legs and underarms) for at least 48 hours prior to first CHG shower. It is OK to shave your face.  Please follow these instructions carefully.   1. Shower the NIGHT BEFORE SURGERY and the MORNING OF SURGERY with CHG.   2. If you chose to wash your hair, wash your hair first as usual with your normal shampoo.  3. After you shampoo, rinse your hair and body thoroughly to remove the shampoo.  4. Use CHG as you would any other liquid soap. You can apply CHG directly to the skin and wash gently with a scrungie or a clean washcloth.   5. Apply the CHG Soap to your body ONLY FROM THE NECK DOWN.  Do not use on open wounds or open sores. Avoid contact with your eyes, ears, mouth and genitals (private parts). Wash Face and genitals (private parts)  with your normal soap.  6. Wash thoroughly, paying special attention to the area where your surgery will be performed.  7. Thoroughly rinse your body with warm water from the neck down.  8. DO NOT shower/wash with your normal soap after using and rinsing off the CHG Soap.  9. Pat yourself dry with a CLEAN TOWEL.  10. Wear CLEAN PAJAMAS to bed the night before surgery, wear comfortable clothes the morning of surgery  11. Place CLEAN SHEETS on your bed the night of your first shower and DO NOT SLEEP WITH PETS.  Day of Surgery:  Do not apply any deodorants/lotions.  Please wear clean clothes to the hospital/surgery center.   Remember to brush your teeth WITH YOUR REGULAR TOOTHPASTE.   Please read over the following fact sheets that you were given.

## 2018-06-22 ENCOUNTER — Inpatient Hospital Stay (HOSPITAL_COMMUNITY)
Admission: RE | Admit: 2018-06-22 | Discharge: 2018-06-22 | Disposition: A | Discharge status: Home / Self Care | Payer: Self-pay | Source: Ambulatory Visit

## 2018-06-23 ENCOUNTER — Ambulatory Visit (HOSPITAL_COMMUNITY): Admission: RE | Admit: 2018-06-23 | Payer: Self-pay | Source: Home / Self Care | Admitting: Otolaryngology

## 2018-06-23 ENCOUNTER — Encounter (HOSPITAL_COMMUNITY): Admission: RE | Payer: Self-pay | Source: Home / Self Care

## 2018-06-23 SURGERY — LARYNGOSCOPY, DIRECT
Anesthesia: General

## 2018-09-05 ENCOUNTER — Emergency Department: Payer: Self-pay

## 2018-09-05 ENCOUNTER — Other Ambulatory Visit: Payer: Self-pay | Admitting: Urology

## 2018-09-05 ENCOUNTER — Encounter: Payer: Self-pay | Admitting: Emergency Medicine

## 2018-09-05 ENCOUNTER — Emergency Department
Admission: EM | Admit: 2018-09-05 | Discharge: 2018-09-05 | Disposition: A | Discharge status: Home / Self Care | Payer: Self-pay | Attending: Emergency Medicine | Admitting: Emergency Medicine

## 2018-09-05 ENCOUNTER — Other Ambulatory Visit: Payer: Self-pay

## 2018-09-05 DIAGNOSIS — N200 Calculus of kidney: Secondary | ICD-10-CM | POA: Insufficient documentation

## 2018-09-05 DIAGNOSIS — F17210 Nicotine dependence, cigarettes, uncomplicated: Secondary | ICD-10-CM | POA: Insufficient documentation

## 2018-09-05 DIAGNOSIS — R109 Unspecified abdominal pain: Secondary | ICD-10-CM

## 2018-09-05 DIAGNOSIS — N201 Calculus of ureter: Secondary | ICD-10-CM

## 2018-09-05 DIAGNOSIS — I10 Essential (primary) hypertension: Secondary | ICD-10-CM | POA: Insufficient documentation

## 2018-09-05 LAB — URINALYSIS, COMPLETE (UACMP) WITH MICROSCOPIC
Bacteria, UA: NONE SEEN
Bilirubin Urine: NEGATIVE
Glucose, UA: NEGATIVE mg/dL
Ketones, ur: NEGATIVE mg/dL
Leukocytes,Ua: NEGATIVE
Nitrite: POSITIVE — AB
Protein, ur: 100 mg/dL — AB
RBC / HPF: >50 RBC/hpf — ABNORMAL HIGH (ref 0–5)
Specific Gravity, Urine: 1.024 (ref 1.005–1.030)
Squamous Epithelial / HPF: NONE SEEN (ref 0–5)
pH: 5 (ref 5.0–8.0)

## 2018-09-05 LAB — COMPREHENSIVE METABOLIC PANEL
ALT: 18 U/L (ref 0–44)
AST: 25 U/L (ref 15–41)
Albumin: 4.4 g/dL (ref 3.5–5.0)
Alkaline Phosphatase: 74 U/L (ref 38–126)
Anion gap: 8 (ref 5–15)
BUN: 11 mg/dL (ref 6–20)
CO2: 25 mmol/L (ref 22–32)
Calcium: 9 mg/dL (ref 8.9–10.3)
Chloride: 106 mmol/L (ref 98–111)
Creatinine, Ser: 0.75 mg/dL (ref 0.61–1.24)
GFR calc Af Amer: >60 mL/min (ref >60)
GFR calc non Af Amer: >60 mL/min (ref >60)
Glucose, Bld: 171 mg/dL — ABNORMAL HIGH (ref 70–99)
Potassium: 4 mmol/L (ref 3.5–5.1)
Sodium: 139 mmol/L (ref 135–145)
Total Bilirubin: 0.9 mg/dL (ref 0.3–1.2)
Total Protein: 7.4 g/dL (ref 6.5–8.1)

## 2018-09-05 LAB — CBC
HCT: 47.3 % (ref 39.0–52.0)
Hemoglobin: 15.3 g/dL (ref 13.0–17.0)
MCH: 29.1 pg (ref 26.0–34.0)
MCHC: 32.3 g/dL (ref 30.0–36.0)
MCV: 89.9 fL (ref 80.0–100.0)
Platelets: 327 10*3/uL (ref 150–400)
RBC: 5.26 MIL/uL (ref 4.22–5.81)
RDW: 13.2 % (ref 11.5–15.5)
WBC: 15.5 10*3/uL — ABNORMAL HIGH (ref 4.0–10.5)
nRBC: 0 % (ref 0.0–0.2)

## 2018-09-05 LAB — LIPASE, BLOOD: Lipase: 36 U/L (ref 11–51)

## 2018-09-05 MED ORDER — TAMSULOSIN HCL 0.4 MG PO CAPS: 0.4000 mg | ORAL_CAPSULE | Freq: Every day | ORAL | 0 refills | Status: DC

## 2018-09-05 MED ORDER — SODIUM CHLORIDE 0.9 % IV SOLN
1.5000 mg/kg | Freq: Once | INTRAVENOUS | Status: AC
  Administered 2018-09-05: 90 mg via INTRAVENOUS
  Filled 2018-09-05: qty 4.5

## 2018-09-05 MED ORDER — ONDANSETRON HCL 4 MG/2ML IJ SOLN
4.0000 mg | Freq: Once | INTRAMUSCULAR | Status: AC | PRN
  Administered 2018-09-05: 4 mg via INTRAVENOUS
  Filled 2018-09-05: qty 2

## 2018-09-05 MED ORDER — HYDROMORPHONE HCL 1 MG/ML IJ SOLN
1.0000 mg | Freq: Once | INTRAMUSCULAR | Status: AC
  Administered 2018-09-05: 1 mg via INTRAVENOUS
  Filled 2018-09-05: qty 1

## 2018-09-05 MED ORDER — SODIUM CHLORIDE 0.9 % IV BOLUS
1000.0000 mL | Freq: Once | INTRAVENOUS | Status: AC
  Administered 2018-09-05: 1000 mL via INTRAVENOUS

## 2018-09-05 MED ORDER — ONDANSETRON HCL 4 MG/2ML IJ SOLN
4.0000 mg | Freq: Once | INTRAMUSCULAR | Status: AC
  Administered 2018-09-05: 4 mg via INTRAVENOUS
  Filled 2018-09-05: qty 2

## 2018-09-05 MED ORDER — SODIUM CHLORIDE 0.9% FLUSH: 3.0000 mL | Freq: Once | INTRAVENOUS | Status: DC

## 2018-09-05 MED ORDER — SULFAMETHOXAZOLE-TRIMETHOPRIM 800-160 MG PO TABS: 1.0000 | ORAL_TABLET | Freq: Two times a day (BID) | ORAL | 0 refills | Status: AC

## 2018-09-05 MED ORDER — ONDANSETRON HCL 4 MG PO TABS: 4.0000 mg | ORAL_TABLET | Freq: Three times a day (TID) | ORAL | 0 refills | Status: DC | PRN

## 2018-09-05 MED ORDER — KETOROLAC TROMETHAMINE 30 MG/ML IJ SOLN
30.0000 mg | Freq: Once | INTRAMUSCULAR | Status: AC
  Administered 2018-09-05: 30 mg via INTRAVENOUS
  Filled 2018-09-05: qty 1

## 2018-09-05 MED ORDER — OXYCODONE-ACETAMINOPHEN 5-325 MG PO TABS: 1.0000 | ORAL_TABLET | ORAL | 0 refills | Status: DC | PRN

## 2018-09-05 NOTE — ED Provider Notes (Signed)
[facility] Emergency Department Provider Note    ____________________________________________   I have reviewed the triage vital signs and the nursing notes.   HISTORY  Chief Complaint Flank Pain and Abdominal Pain   History limited by: Not Limited   HPI Sean Walters is a 33 y.o. male who presents to the emergency department today because of concern for left flank. The patient states that the pains started tonight. They are severe. They have been constant. Has had some nausea and vomiting with the pain. The patient does have a history of kidney stones and this feels similar to previous episodes. He has never required a lithotripsy. He denies any fevers.    Records reviewed. Per medical record review patient has a history of ibs  Past Medical History:  Diagnosis Date  . Chronic prostatitis   . Irritable bowel syndrome   . Other psoriasis   . Psoriatic arthritis (HCC)    Dr Gavin Potters  . Sacroiliitis [facility])     Patient Active Problem List   Diagnosis Date Noted  . Acute non-recurrent sinusitis 01/24/2018  . Urinary urgency 01/30/2015  . Poison ivy dermatitis 10/16/2013  . Chronic prostatitis 04/05/2012  . Vasovagal syncope 02/22/2012  . IBS 11/26/2006  . TENSION HEADACHE 06/24/2006  . MIGRAINE, UNSPEC., W/O INTRACTABLE MIGRAINE 06/24/2006  . HYPERTENSION, BENIGN SYSTEMIC 06/24/2006  . SEBORRHEIC DERMATITIS, NOS 06/24/2006  . PSORIASIS 06/24/2006  . TACHYCARDIA 06/24/2006    Past Surgical History:  Procedure Laterality Date  . CYSTOSCOPY  [id number]   normal  . ESOPHAGOGASTRODUODENOSCOPY  [id number]  . US ECHOCARDIOGRAPHY  07/31/02    Prior to Admission medications   Medication Sig Start Date End Date Taking? Authorizing Provider  Alum & Mag Hydroxide-Simeth (GI COCKTAIL) SUSP suspension Alum & mag hydroxide-simeth  and viscous lidocaine 1:1 10 ml ac and hs 06/09/18   Tommi Rumps, PA-C    Allergies Patient has no known  allergies.  Family History  Problem Relation Age of Onset  . Coronary artery disease Neg Hx   . Hypertension Neg Hx   . Diabetes Neg Hx   . Kidney cancer Neg Hx   . Prostate cancer Neg Hx     Social History Social History   Tobacco Use  . Smoking status: Current Every Day Smoker    Packs/day: 1.00    Years: 2.00    Pack years: 2.00    Types: Cigarettes  . Smokeless tobacco: Never Used  . Tobacco comment: gave 1-800-quit now  Substance Use Topics  . Alcohol use: No  . Drug use: No    Review of Systems Constitutional: No fever/chills Eyes: No visual changes. ENT: No sore throat. Cardiovascular: Denies chest pain. Respiratory: Denies shortness of breath. Gastrointestinal: Positive for left flank pain with nausea and vomiting.  Genitourinary: Negative for dysuria. Musculoskeletal: Negative for back pain. Skin: Negative for rash. Neurological: Negative for headaches, focal weakness or numbness.  ____________________________________________   PHYSICAL EXAM:  VITAL SIGNS: ED Triage Vitals  Enc Vitals Group     BP 09/05/18 0355 104/75     Pulse Rate 09/05/18 0355 (!) 102     Resp 09/05/18 0355 19     Temp 09/05/18 0355 98.4 F (36.9 C)     Temp src --      SpO2 09/05/18 0355 99 %     Weight 09/05/18 0439 132 lb (59.9 kg)     Height 09/05/18 0439 5\' 10"  (1.778 m)     Head Circumference --  Peak Flow --      Pain Score 09/05/18 0350 10   Constitutional: Alert and oriented. Appears uncomfortable.  Eyes: Conjunctivae are normal.  ENT      Head: Normocephalic and atraumatic.      Nose: No congestion/rhinnorhea.      Mouth/Throat: Mucous membranes are moist.      Neck: No stridor. Hematological/Lymphatic/Immunilogical: No cervical lymphadenopathy. Cardiovascular: Normal rate, regular rhythm.  No murmurs, rubs, or gallops.  Respiratory: Normal respiratory effort without tachypnea nor retractions. Breath sounds are clear and equal bilaterally. No  wheezes/rales/rhonchi. Gastrointestinal: Soft and non tender. Genitourinary: Deferred Musculoskeletal: Normal range of motion in all extremities. No lower extremity edema. Neurologic:  Normal speech and language. No gross focal neurologic deficits are appreciated.  Skin:  Skin is warm, dry and intact. No rash noted. Psychiatric: Mood and affect are normal. Speech and behavior are normal. Patient exhibits appropriate insight and judgment.  ____________________________________________    LABS (pertinent positives/negatives)  CMP wnl except glu 171 Lipase 36 UA cloudy, large hgb dipstick, nitrite positive, wbc 6-10, rbc >50 CBC wbc 15.5, hgb 15.3, plt 327 ____________________________________________   EKG  None  ____________________________________________    RADIOLOGY  CT renal 3x5 mm left ureter stone  ____________________________________________   PROCEDURES  Procedures  ____________________________________________   INITIAL IMPRESSION / ASSESSMENT AND PLAN / ED COURSE  Pertinent labs & imaging results that were available during my care of the patient were reviewed by me and considered in my medical decision making (see chart for details).   Patient presented to the emergency department today because of concern for left flank pain. Patient with history of kidney stones. CT scan shows a 3x585mm stone in the left ureter. UA did have nitrite positive. Discussed with Dr. Richardo HanksSninsky. Thinks likely false positive due to high blood in urine and given lack of fever reasonable to treat with oral antibiotics and follow up in clinic. Will send culture. Discussed findings with patient.   ____________________________________________   FINAL CLINICAL IMPRESSION(S) / ED DIAGNOSES  Final diagnoses:  Kidney stone     Note: This dictation was prepared with Dragon dictation. Any transcriptional errors that result from this process are unintentional     Phineas SemenGoodman, Yumi Insalaco,  MD 09/05/18 [phone]

## 2018-09-05 NOTE — ED Notes (Signed)
Pt's fluids finished about the same time he rang out for more pain and nausea medication; plan of care per Dr Derrill Kay, pt to get more zofran for nausea and a urine specimen will need to be collected; pt into the bathroom to try to void;

## 2018-09-05 NOTE — ED Provider Notes (Signed)
Patient reports pain has improved somewhat after lidocaine, will continue to monitor    Patient is reported his pain is 0 out of 10.  And he would like to go home.  I did discuss strict return precautions including fevers worsening pain etc., he agrees with plan    Jene Every, MD 09/05/18 1229

## 2018-09-05 NOTE — ED Triage Notes (Signed)
Pt says he awakened about 45 minutes ago with sharp pains to left back/flank/abd; history of kidney stones and says this feels the same; pt now with N/V; denies urinary s/s; diaphoretic in triage

## 2018-09-05 NOTE — Discharge Instructions (Signed)
Please seek medical attention for any high fevers, chest pain, shortness of breath, change in behavior, persistent vomiting, bloody stool or any other new or concerning symptoms.  

## 2018-09-05 NOTE — ED Notes (Signed)
Pt standing at bedside, states "trying to get some energy back up". No c/o pain at this time.

## 2018-09-06 LAB — URINE CULTURE: Culture: NO GROWTH

## 2018-09-08 ENCOUNTER — Ambulatory Visit: Payer: Self-pay | Admitting: Urology

## 2018-09-08 ENCOUNTER — Encounter: Payer: Self-pay | Admitting: Urology

## 2018-09-11 ENCOUNTER — Emergency Department: Payer: Self-pay

## 2018-09-11 ENCOUNTER — Emergency Department
Admission: EM | Admit: 2018-09-11 | Discharge: 2018-09-11 | Disposition: A | Discharge status: Home / Self Care | Payer: Self-pay | Attending: Emergency Medicine | Admitting: Emergency Medicine

## 2018-09-11 ENCOUNTER — Encounter: Payer: Self-pay | Admitting: Emergency Medicine

## 2018-09-11 ENCOUNTER — Other Ambulatory Visit: Payer: Self-pay

## 2018-09-11 DIAGNOSIS — N200 Calculus of kidney: Secondary | ICD-10-CM | POA: Insufficient documentation

## 2018-09-11 DIAGNOSIS — Z79899 Other long term (current) drug therapy: Secondary | ICD-10-CM | POA: Insufficient documentation

## 2018-09-11 DIAGNOSIS — F17210 Nicotine dependence, cigarettes, uncomplicated: Secondary | ICD-10-CM | POA: Insufficient documentation

## 2018-09-11 LAB — CHLAMYDIA/NGC RT PCR (ARMC ONLY)??????????: N gonorrhoeae: NOT DETECTED

## 2018-09-11 LAB — URINALYSIS, COMPLETE (UACMP) WITH MICROSCOPIC
Bilirubin Urine: NEGATIVE
Glucose, UA: NEGATIVE mg/dL
Ketones, ur: 20 mg/dL — AB
Leukocytes,Ua: NEGATIVE
Nitrite: NEGATIVE
Protein, ur: 30 mg/dL — AB
RBC / HPF: >50 RBC/hpf — ABNORMAL HIGH (ref 0–5)
Specific Gravity, Urine: 1.027 (ref 1.005–1.030)
pH: 5 (ref 5.0–8.0)

## 2018-09-11 LAB — CHLAMYDIA/NGC RT PCR (ARMC ONLY): Chlamydia Tr: NOT DETECTED

## 2018-09-11 MED ORDER — PHENAZOPYRIDINE HCL 200 MG PO TABS: 200.0000 mg | ORAL_TABLET | Freq: Three times a day (TID) | ORAL | 0 refills | Status: DC | PRN

## 2018-09-11 MED ORDER — HYDROMORPHONE HCL 1 MG/ML IJ SOLN
1.0000 mg | Freq: Once | INTRAMUSCULAR | Status: AC
  Administered 2018-09-11: 07:00:00 1 mg via INTRAVENOUS
  Filled 2018-09-11: qty 1

## 2018-09-11 MED ORDER — ONDANSETRON HCL 4 MG/2ML IJ SOLN
4.0000 mg | Freq: Once | INTRAMUSCULAR | Status: AC
  Administered 2018-09-11: 4 mg via INTRAVENOUS

## 2018-09-11 MED ORDER — ONDANSETRON HCL 4 MG/2ML IJ SOLN
INTRAMUSCULAR | Status: AC
  Filled 2018-09-11: qty 2

## 2018-09-11 MED ORDER — HYDROMORPHONE HCL 1 MG/ML IJ SOLN
INTRAMUSCULAR | Status: AC
  Filled 2018-09-11: qty 1

## 2018-09-11 MED ORDER — SODIUM CHLORIDE 0.9 % IV BOLUS
1000.0000 mL | Freq: Once | INTRAVENOUS | Status: AC
  Administered 2018-09-11: 1000 mL via INTRAVENOUS

## 2018-09-11 MED ORDER — HYDROMORPHONE HCL 1 MG/ML IJ SOLN
1.0000 mg | Freq: Once | INTRAMUSCULAR | Status: AC
  Administered 2018-09-11: 1 mg via INTRAVENOUS

## 2018-09-11 MED ORDER — KETOROLAC TROMETHAMINE 30 MG/ML IJ SOLN
30.0000 mg | Freq: Once | INTRAMUSCULAR | Status: AC
  Administered 2018-09-11: 30 mg via INTRAVENOUS
  Filled 2018-09-11: qty 1

## 2018-09-11 MED ORDER — HYDROMORPHONE HCL 1 MG/ML IJ SOLN
1.0000 mg | Freq: Once | INTRAMUSCULAR | Status: AC
  Administered 2018-09-11: 1 mg via INTRAVENOUS
  Filled 2018-09-11: qty 1

## 2018-09-11 MED ORDER — ONDANSETRON HCL 4 MG/2ML IJ SOLN
4.0000 mg | Freq: Once | INTRAMUSCULAR | Status: AC
  Administered 2018-09-11: 4 mg via INTRAVENOUS
  Filled 2018-09-11: qty 2

## 2018-09-11 MED ORDER — PHENAZOPYRIDINE HCL 200 MG PO TABS
200.0000 mg | ORAL_TABLET | Freq: Once | ORAL | Status: AC
  Administered 2018-09-11: 200 mg via ORAL
  Filled 2018-09-11: qty 1

## 2018-09-11 NOTE — Discharge Instructions (Signed)
Take the pain medicine you were prescribed at your last visit as needed.  In addition you can take the Pyridium that you are prescribed today to help with the pain in your penis.  Return to the ER for new, worsening, or persistent pain, fever, vomiting, or any other new or worsening symptoms that concern you.  The kidney stone should pass shortly.

## 2018-09-11 NOTE — ED Notes (Signed)
Report to valerie, rn.  

## 2018-09-11 NOTE — ED Triage Notes (Signed)
Pt arrives ambulatory to triage with c/o left flank pain since Monday when he was diagnosed with a kidney stone. Pt reports that he is unable to pass said stone at this time. Pt is in visible pain and discomfort at this time.

## 2018-09-11 NOTE — ED Provider Notes (Signed)
[facility] Emergency Department Provider Note    First MD Initiated Contact with Patient 09/11/18 [phone]     (approximate)  I have reviewed the triage vital signs and the nursing notes.   HISTORY  Chief Complaint Flank Pain    HPI Sean Walters is a 33 y.o. male with below list of previous medical conditions including prostatitis and recently diagnosed 3 x 5 mm proximal left urethral stone on 09/05/2018 returns to the emergency department with 10 out of 10 left flank pain.  Patient also admits to penile discomfort which he states is consistent with previous episodes of prostatitis.  Patient denies any fever afebrile on presentation temperature 97.4.        Past Medical History:  Diagnosis Date   Chronic prostatitis    Irritable bowel syndrome    Other psoriasis    Psoriatic arthritis (HCC)    Dr Gavin Potters   Sacroiliitis [facility])     Patient Active Problem List   Diagnosis Date Noted   Acute non-recurrent sinusitis 01/24/2018   Urinary urgency 01/30/2015   Poison ivy dermatitis 10/16/2013   Chronic prostatitis 04/05/2012   Vasovagal syncope 02/22/2012   IBS 11/26/2006   TENSION HEADACHE 06/24/2006   MIGRAINE, UNSPEC., W/O INTRACTABLE MIGRAINE 06/24/2006   HYPERTENSION, BENIGN SYSTEMIC 06/24/2006   SEBORRHEIC DERMATITIS, NOS 06/24/2006   PSORIASIS 06/24/2006   TACHYCARDIA 06/24/2006    Past Surgical History:  Procedure Laterality Date   CYSTOSCOPY  [id number]   normal   ESOPHAGOGASTRODUODENOSCOPY  [id number]   US ECHOCARDIOGRAPHY  07/31/02    Prior to Admission medications   Medication Sig Start Date End Date Taking? Authorizing Provider  ondansetron (ZOFRAN) 4 MG tablet Take 1 tablet (4 mg total) by mouth every 8 (eight) hours as needed. 09/05/18   Phineas Semen, MD  oxyCODONE-acetaminophen (PERCOCET) 5-325 MG tablet Take 1 tablet by mouth every 4 (four) hours as needed for severe pain. 09/05/18 09/05/19  Phineas Semen,  MD  phenazopyridine (PYRIDIUM) 200 MG tablet Take 1 tablet (200 mg total) by mouth 3 (three) times daily as needed for up to 3 days for pain. 09/11/18 09/14/18  Dionne Bucy, MD  tamsulosin (FLOMAX) 0.4 MG CAPS capsule Take 1 capsule (0.4 mg total) by mouth daily. 09/05/18   Phineas Semen, MD    Allergies Patient has no known allergies.  Family History  Problem Relation Age of Onset   Coronary artery disease Neg Hx    Hypertension Neg Hx    Diabetes Neg Hx    Kidney cancer Neg Hx    Prostate cancer Neg Hx     Social History Social History   Tobacco Use   Smoking status: Current Every Day Smoker    Packs/day: 1.00    Years: 2.00    Pack years: 2.00    Types: Cigarettes   Smokeless tobacco: Never Used   Tobacco comment: gave 1-800-quit now  Substance Use Topics   Alcohol use: No   Drug use: No    Review of Systems Constitutional: No fever/chills Eyes: No visual changes. ENT: No sore throat. Cardiovascular: Denies chest pain. Respiratory: Denies shortness of breath. Gastrointestinal: Positive for left flank pain..  No nausea, no vomiting.  No diarrhea.  No constipation. Genitourinary: Negative for dysuria. Musculoskeletal: Negative for neck pain.  Negative for back pain. Integumentary: Negative for rash. Neurological: Negative for headaches, focal weakness or numbness.   ____________________________________________   PHYSICAL EXAM:  VITAL SIGNS: ED Triage Vitals  Enc Vitals Group  BP 09/11/18 0559 117/80     Pulse Rate 09/11/18 0559 (!) 131     Resp 09/11/18 0559 16     Temp 09/11/18 0559 (!) 97.4 F (36.3 C)     Temp Source 09/11/18 0559 Oral     SpO2 09/11/18 0559 98 %     Weight 09/11/18 0551 59.9 kg (132 lb)     Height 09/11/18 0551 1.778 m ( )     Head Circumference --      Peak Flow --      Pain Score 09/11/18 0551 10     Pain Loc --      Pain Edu? --      Excl. in GC? --    Constitutional: Alert and oriented.  Apparent  discomfort Eyes: Conjunctivae are normal. Mouth/Throat: Mucous membranes are moist.  Oropharynx non-erythematous. Neck: No stridor.   Cardiovascular: Normal rate, regular rhythm. Good peripheral circulation. Grossly normal heart sounds. Respiratory: Normal respiratory effort.  No retractions. No audible wheezing. Gastrointestinal: Soft and nontender. No distention.  Musculoskeletal: No lower extremity tenderness nor edema. No gross deformities of extremities. Neurologic:  Normal speech and language. No gross focal neurologic deficits are appreciated.  Skin:  Skin is warm, dry and intact. No rash noted. Psychiatric: Mood and affect are normal. Speech and behavior are normal.  ____________________________________________   LABS (all labs ordered are listed, but only abnormal results are displayed)  Labs Reviewed  URINALYSIS, COMPLETE (UACMP) WITH MICROSCOPIC - Abnormal; Notable for the following components:      Result Value   Color, Urine YELLOW (*)    APPearance HAZY (*)    Hgb urine dipstick LARGE (*)    Ketones, ur 20 (*)    Protein, ur 30 (*)    RBC / HPF >50 (*)    Bacteria, UA RARE (*)    All other components within normal limits  CHLAMYDIA/NGC RT PCR ([facility] ONLY)       Procedures   ____________________________________________   INITIAL IMPRESSION / MDM / ASSESSMENT AND PLAN / ED COURSE  As part of my medical decision making, I reviewed the following data within the electronic MEDICAL RECORD NUMBER   33 year old male presented with above stated history and physical exam consistent with left ureterolithiasis which was recently diagnosed on 09/05/2018.  Patient given 1 mg of IV Dilaudid with minimal pain improvement and as such patient was given an additional 1 mg of IV Dilaudid.  On reevaluation patient states that pain is improving.  Patient's care transferred to come in physician awaiting pain control.  *Jakobi Thetford Nwosu was evaluated in Emergency Department on  09/14/2018 for the symptoms described in the history of present illness. He was evaluated in the context of the global COVID-19 pandemic, which necessitated consideration that the patient might be at risk for infection with the SARS-CoV-2 virus that causes COVID-19. Institutional protocols and algorithms that pertain to the evaluation of patients at risk for COVID-19 are in a state of rapid change based on information released by regulatory bodies including the CDC and federal and state organizations. These policies and algorithms were followed during the patient's care in the ED.  Some ED evaluations and interventions Ngo be delayed as a result of limited staffing during the pandemic.*     ____________________________________________  FINAL CLINICAL IMPRESSION(S) / ED DIAGNOSES  Final diagnoses:  Kidney stone on left side     MEDICATIONS GIVEN DURING THIS VISIT:  Medications  HYDROmorphone (DILAUDID) injection 1 mg (1 mg  Intravenous Given 09/11/18 0610)  ondansetron (ZOFRAN) injection 4 mg (4 mg Intravenous Given 09/11/18 0610)  sodium chloride 0.9 % bolus 1,000 mL (0 mLs Intravenous Stopped 09/11/18 0711)  HYDROmorphone (DILAUDID) injection 1 mg (1 mg Intravenous Given 09/11/18 0708)  HYDROmorphone (DILAUDID) injection 1 mg (1 mg Intravenous Given 09/11/18 0829)  ketorolac (TORADOL) 30 MG/ML injection 30 mg (30 mg Intravenous Given 09/11/18 0857)  ondansetron (ZOFRAN) injection 4 mg (4 mg Intravenous Given 09/11/18 1101)  phenazopyridine (PYRIDIUM) tablet 200 mg (200 mg Oral Given 09/11/18 1302)     ED Discharge Orders         Ordered    phenazopyridine (PYRIDIUM) 200 MG tablet  3 times daily PRN     09/11/18 1342           Note:  This document was prepared using Dragon voice recognition software and [place] include unintentional dictation errors.   Darci CurrentBrown, [place], MD 09/14/18 [phone]

## 2018-09-11 NOTE — ED Notes (Signed)
Given ice chips, ok per dr Marisa Severin

## 2018-09-11 NOTE — ED Notes (Signed)
Assessment: pt states severe left flank pain. Pt states known history of renal calculi. Pt appears very uncomfortable. Urine has foul odor, is cloudy and dark.

## 2018-09-11 NOTE — ED Notes (Signed)
Pt nauseated and would like something for it. Dr Marisa Severin notified.

## 2018-09-11 NOTE — ED Provider Notes (Signed)
-----------------------------------------   1:42 PM on 09/11/2018 -----------------------------------------  I took over care of this patient from Dr. Manson Passey.  The patient had a recent diagnosis of a left-sided ureteral stone and recurrent worsening pain.  He continued to have relatively severe pain after 2 doses of Dilaudid, but it improved substantially after a third dose of Dilaudid and some Toradol.  In addition the patient reports some penile pain.  GC/CT swab is negative, and he is getting relief with Pyridium.  I consulted Dr. Mena Goes from urology.  He recommended obtaining a renal ultrasound to evaluate for hydronephrosis.  The ultrasound shows mild hydronephrosis.  Dr. Mena Goes reviewed the KUB x-ray and believes that he sees the stone close to the left UVJ.  He recommended that the patient could go home and would not need intervention if his pain is controlled.  At this time, after almost 8 hours in the ED the patient has good pain relief and feels comfortable going home.  I counseled him on the results of the work-up and on the urology recommendations.  Return precautions given, and he expresses understanding.  He still has pain medicine at home and states he only took a few pills.  I will prescribe Pyridium as well.   Dionne Bucy, MD 09/11/18 1344

## 2018-09-12 ENCOUNTER — Telehealth: Payer: Self-pay

## 2018-09-12 NOTE — Telephone Encounter (Signed)
Left patient message to call 

## 2018-09-12 NOTE — Telephone Encounter (Signed)
-----   Message from Sondra Come, MD sent at 09/08/2018  5:08 PM EDT ----- Regarding: follow up stone patient Please call patient to see if he passed his stone. If he is still having pain please add him on to see a provider early next week, a KUB is already ordered.  Thanks Legrand Rams, MD 09/08/2018

## 2018-09-14 ENCOUNTER — Encounter: Payer: Self-pay | Admitting: Family Medicine

## 2018-09-14 ENCOUNTER — Ambulatory Visit (INDEPENDENT_AMBULATORY_CARE_PROVIDER_SITE_OTHER): Payer: Self-pay | Admitting: Family Medicine

## 2018-09-14 VITALS — Temp 98.9°F

## 2018-09-14 DIAGNOSIS — N411 Chronic prostatitis: Secondary | ICD-10-CM

## 2018-09-14 MED ORDER — PHENAZOPYRIDINE HCL 200 MG PO TABS: 200.0000 mg | ORAL_TABLET | Freq: Three times a day (TID) | ORAL | 0 refills | Status: AC | PRN

## 2018-09-14 MED ORDER — CIPROFLOXACIN HCL 500 MG PO TABS: 500.0000 mg | ORAL_TABLET | Freq: Two times a day (BID) | ORAL | 0 refills | Status: AC

## 2018-09-14 NOTE — Assessment & Plan Note (Signed)
Pt with long hx of symptoms and feels these are similar. Was previously told it was related to caffeine intake which he has stopped. Advised trying to avoid excess sugary beverages (primiarly drinks caffeine free soda) and increased H20. Will do short course of Abx to see if symptoms improve - last urine in the hospital was negative for UTI. Also advised adding stool softener to see if constipation was impacting bladder emptying. Encouraged reaching out to urology (already established) if symptoms are not improving.

## 2018-09-14 NOTE — Progress Notes (Signed)
I connected with Sean Walters on 09/14/18 at  9:00 AM EDT by video and verified that I am speaking with the correct person using two identifiers.   I discussed the limitations, risks, security and privacy concerns of performing an evaluation and management service by video and the availability of in person appointments. I also discussed with the patient that there Rigor be a patient responsible charge related to this service. The patient expressed understanding and agreed to proceed.  Patient location: Home Provider [facility] Participants: Lynnda ChildJessica R  and Sean BoucheJoshua M Catanzaro   Subjective:     Sean BoucheJoshua M Morning is a 33 y.o. male presenting for Urinary Urgency (symptoms started on 09/11/2018. Feels like his bladder not emptying out all the way. Some stinging with urination at times.)     Dysuria   This is a recurrent problem. The current episode started in the past 7 days. The problem occurs every urination. There has been no fever. He is not sexually active. Associated symptoms include nausea and urgency. Pertinent negatives include no flank pain, frequency, hematuria, hesitancy or vomiting. Associated symptoms comments: Incomplete emptying, constipation. Treatments tried: tamsulosin, pyridium  His past medical history is significant for kidney stones. chronic prostatitis    Stopped drinking tea Drinks caffeine free sprite, crush, Gatorade  Was able to use the bathroom  No episodes in several years even with high sugar drinks but with avoiding caffeine    Review of Systems  Gastrointestinal: Positive for nausea. Negative for vomiting.  Genitourinary: Positive for dysuria and urgency. Negative for flank pain, frequency, hematuria and hesitancy.     Social History   Tobacco Use  Smoking Status Current Every Day Smoker  . Packs/day: 1.00  . Years: 2.00  . Pack years: 2.00  . Types: Cigarettes  Smokeless Tobacco Never Used  Tobacco Comment   gave 1-800-quit now        Objective:   BP Readings from Last 3 Encounters:  09/11/18 108/67  09/05/18 110/68  06/09/18 122/81   Wt Readings from Last 3 Encounters:  09/11/18 132 lb (59.9 kg)  09/05/18 132 lb (59.9 kg)  06/09/18 128 lb (58.1 kg)   Temp 98.9 F (37.2 C) Comment: per patient   Physical Exam Constitutional:      Appearance: Normal appearance. He is not ill-appearing.  HENT:     Head: Normocephalic and atraumatic.     Right Ear: External ear normal.     Left Ear: External ear normal.  Eyes:     Conjunctiva/sclera: Conjunctivae normal.  Pulmonary:     Effort: Pulmonary effort is normal. No respiratory distress.  Neurological:     Mental Status: He is alert. Mental status is at baseline.  Psychiatric:        Mood and Affect: Mood normal.        Behavior: Behavior normal.        Thought Content: Thought content normal.        Judgment: Judgment normal.          Assessment & Plan:   Problem List Items Addressed This Visit      Genitourinary   Chronic prostatitis - Primary    Pt with long hx of symptoms and feels these are similar. Was previously told it was related to caffeine intake which he has stopped. Advised trying to avoid excess sugary beverages (primiarly drinks caffeine free soda) and increased H20. Will do short course of Abx to see if symptoms improve -  last urine in the hospital was negative for UTI. Also advised adding stool softener to see if constipation was impacting bladder emptying. Encouraged reaching out to urology (already established) if symptoms are not improving.       Relevant Medications   ciprofloxacin (CIPRO) 500 MG tablet   phenazopyridine (PYRIDIUM) 200 MG tablet       Return if symptoms worsen or fail to improve.  Lynnda Child, MD

## 2018-09-20 ENCOUNTER — Telehealth: Payer: Self-pay

## 2018-09-20 NOTE — Telephone Encounter (Signed)
Left message to see how he is feeling after recent ER visits for Kidney Stone.

## 2019-02-02 ENCOUNTER — Telehealth: Payer: Self-pay | Admitting: Internal Medicine

## 2019-02-02 NOTE — Telephone Encounter (Signed)
Spoke to the pt and advised him what Dr Silvio Pate said. Because he has the [facility], I believe we have to set up a referral. There are certain doctors that see a certain number of orange card patients a month or even a year. Will forward to Charmaine.

## 2019-02-02 NOTE — Telephone Encounter (Signed)
Patient stated he is needing a referral to an ENT office. Stated he has a fishbone stuck in his throat and it has been there for 2 1/2 months.  Does he need to see you first for this?   Please advise

## 2019-02-02 NOTE — Telephone Encounter (Signed)
It is unlikely that he has a bone stuck in his throat that long. Probably just ongoing irritation since the incident. I don't need to see him first--and he can probably just call the ENT himself without needing a referral

## 2019-02-03 ENCOUNTER — Encounter: Payer: Self-pay | Admitting: Internal Medicine

## 2019-02-03 ENCOUNTER — Other Ambulatory Visit: Payer: Self-pay

## 2019-02-03 ENCOUNTER — Ambulatory Visit (INDEPENDENT_AMBULATORY_CARE_PROVIDER_SITE_OTHER): Payer: Self-pay | Admitting: Internal Medicine

## 2019-02-03 VITALS — BP 102/64 | HR 103 | Temp 98.6°F | Ht 70.0 in | Wt 125.0 lb

## 2019-02-03 DIAGNOSIS — S29019A Strain of muscle and tendon of unspecified wall of thorax, initial encounter: Secondary | ICD-10-CM | POA: Insufficient documentation

## 2019-02-03 DIAGNOSIS — R1310 Dysphagia, unspecified: Secondary | ICD-10-CM

## 2019-02-03 MED ORDER — CYCLOBENZAPRINE HCL 10 MG PO TABS: 5.0000 mg | ORAL_TABLET | Freq: Every evening | ORAL | 0 refills | Status: DC | PRN

## 2019-02-03 NOTE — Progress Notes (Signed)
Subjective:    Patient ID: Sean Walters, male    DOB: 02-Jun-1985, 33 y.o.   MRN: [id number]  HPI Here due to back pain Has strained it before--mostly in upper back between scapulae Stops at spine--"can feel the muscle" Now can feel it with breathing  Was picking up pressure washer 5 days ago Felt a "pull" then Really bad the next morning Now notices bad pain in AM---better with heating pad and loosens up as day goes on  No current outpatient medications on file prior to visit.   No current facility-administered medications on file prior to visit.     No Known Allergies  Past Medical History:  Diagnosis Date  . Chronic prostatitis   . Irritable bowel syndrome   . Other psoriasis   . Psoriatic arthritis ([place])    Dr Jefm Bryant  . Sacroiliitis [facility])     Past Surgical History:  Procedure Laterality Date  . CYSTOSCOPY  [id number]   normal  . ESOPHAGOGASTRODUODENOSCOPY  [id number]  . US ECHOCARDIOGRAPHY  07/31/02    Family History  Problem Relation Age of Onset  . Coronary artery disease Neg Hx   . Hypertension Neg Hx   . Diabetes Neg Hx   . Kidney cancer Neg Hx   . Prostate cancer Neg Hx     Social History   Socioeconomic History  . Marital status: Single    Spouse name: Not on file  . Number of children: Not on file  . Years of education: Not on file  . Highest education level: Not on file  Occupational History  . Occupation: Out of work--disabled due to arthritis  Social Needs  . Financial resource strain: Not on file  . Food insecurity    Worry: Not on file    Inability: Not on file  . Transportation needs    Medical: Not on file    Non-medical: Not on file  Tobacco Use  . Smoking status: Current Every Day Smoker    Packs/day: 1.00    Years: 2.00    Pack years: 2.00    Types: Cigarettes  . Smokeless tobacco: Never Used  . Tobacco comment: gave 1-800-quit now  Substance and Sexual Activity  . Alcohol use: No  . Drug use: No  . Sexual activity:  Not on file  Lifestyle  . Physical activity    Days per week: Not on file    Minutes per session: Not on file  . Stress: Not on file  Relationships  . Social Herbalist on phone: Not on file    Gets together: Not on file    Attends religious service: Not on file    Active member of club or organization: Not on file    Attends meetings of clubs or organizations: Not on file    Relationship status: Not on file  . Intimate partner violence    Fear of current or ex partner: Not on file    Emotionally abused: Not on file    Physically abused: Not on file    Forced sexual activity: Not on file  Other Topics Concern  . Not on file  Social History Narrative  . Not on file   Review of Systems  No fever  Some cough--he notes still from "the fish bone" No SOB      Objective:   Physical Exam  Constitutional: No distress.  Neck: No thyromegaly present.  Cardiovascular: Normal rate, regular rhythm and normal heart sounds. Exam  reveals no gallop.  No murmur heard. Respiratory: Effort normal and breath sounds normal. No respiratory distress. He has no wheezes. He has no rales.  Musculoskeletal:     Comments: Tenderness medial to right mid scapula Normal ROM in right arm--does cause some pain  Lymphadenopathy:    He has no cervical adenopathy.           Assessment & Plan:

## 2019-02-03 NOTE — Patient Instructions (Signed)
You can try ibuprofen 2-3 (400-600mg ) three times a day or 2 naproxen (220mg ) twice a day for the pain.

## 2019-02-03 NOTE — Assessment & Plan Note (Signed)
Still has the throat symptoms and trouble with swallowing Will try to get him back with the ENT

## 2019-02-03 NOTE — Assessment & Plan Note (Signed)
Clearly seems to be muscular Discussed heat, NSAIDs, proper lifting Will Rx some cyclobenzaprine for night

## 2019-02-03 NOTE — Addendum Note (Signed)
Addended by: Viviana Simpler I on: 02/03/2019 12:36 PM   Modules accepted: Orders

## 2019-06-05 ENCOUNTER — Other Ambulatory Visit: Payer: Self-pay

## 2019-06-05 ENCOUNTER — Encounter: Payer: Self-pay | Admitting: Internal Medicine

## 2019-06-05 ENCOUNTER — Ambulatory Visit (INDEPENDENT_AMBULATORY_CARE_PROVIDER_SITE_OTHER): Payer: Self-pay | Admitting: Internal Medicine

## 2019-06-05 DIAGNOSIS — N410 Acute prostatitis: Secondary | ICD-10-CM

## 2019-06-05 LAB — POC URINALSYSI DIPSTICK (AUTOMATED)
Bilirubin, UA: NEGATIVE
Blood, UA: NEGATIVE
Glucose, UA: NEGATIVE
Ketones, UA: NEGATIVE
Leukocytes, UA: NEGATIVE
Nitrite, UA: NEGATIVE
Protein, UA: POSITIVE — AB
Spec Grav, UA: 1.025 (ref 1.010–1.025)
Urobilinogen, UA: 0.2 E.U./dL
pH, UA: 6.5 (ref 5.0–8.0)

## 2019-06-05 MED ORDER — IBUPROFEN 800 MG PO TABS: 800.0000 mg | ORAL_TABLET | Freq: Three times a day (TID) | ORAL | 0 refills | Status: DC

## 2019-06-05 MED ORDER — TAMSULOSIN HCL 0.4 MG PO CAPS: 0.4000 mg | ORAL_CAPSULE | Freq: Every day | ORAL | 0 refills | Status: DC

## 2019-06-05 MED ORDER — SULFAMETHOXAZOLE-TRIMETHOPRIM 800-160 MG PO TABS: 1.0000 | ORAL_TABLET | Freq: Two times a day (BID) | ORAL | 0 refills | Status: DC

## 2019-06-05 NOTE — Patient Instructions (Signed)
Please take the antibiotic for 2 weeks (twice a day)--the sulfamethoxazole/trimethoprim. Use the prescription strength ibuprofen (800mg ) three times a day with food for the next 1-2 weeks---then you can just switch to as needed. Use the tamsulosin for 1 month. Let me know if your symptoms aren't much better by next week

## 2019-06-05 NOTE — Addendum Note (Signed)
Addended by: Eual Fines on: 06/05/2019 05:36 PM   Modules accepted: Orders

## 2019-06-05 NOTE — Progress Notes (Signed)
Subjective:    Patient ID: Sean Walters, male    DOB: 08-09-1985, 34 y.o.   MRN: [id number]  HPI Here due to recurrent bladder symptoms This visit occurred during the SARS-CoV-2 public health emergency.  Safety protocols were in place, including screening questions prior to the visit, additional usage of staff PPE, and extensive cleaning of exam room while observing appropriate contact time as indicated for disinfecting solutions.   Started with urinary issues for a week No dysuria but seems to have incomplete emptying Some nausea Had been drinking a new beverage for a week or so Didn't realize it had caffeine again--stopped it Has been using ibuprofen 200mg  (400mg  every 6 hours or so alternating with tylenol) No hematuria No pain like stone in past  No current outpatient medications on file prior to visit.   No current facility-administered medications on file prior to visit.    Allergies  Allergen Reactions  . Caffeine Other (See Comments)    Bladder irritation    Past Medical History:  Diagnosis Date  . Chronic prostatitis   . Irritable bowel syndrome   . Other psoriasis   . Psoriatic arthritis ([place])    Dr Jefm Bryant  . Sacroiliitis [facility])     Past Surgical History:  Procedure Laterality Date  . CYSTOSCOPY  [id number]   normal  . ESOPHAGOGASTRODUODENOSCOPY  [id number]  . US ECHOCARDIOGRAPHY  07/31/02    Family History  Problem Relation Age of Onset  . Coronary artery disease Neg Hx   . Hypertension Neg Hx   . Diabetes Neg Hx   . Kidney cancer Neg Hx   . Prostate cancer Neg Hx     Social History   Socioeconomic History  . Marital status: Single    Spouse name: Not on file  . Number of children: Not on file  . Years of education: Not on file  . Highest education level: Not on file  Occupational History  . Occupation: Out of work--disabled due to arthritis  Tobacco Use  . Smoking status: Current Every Day Smoker    Packs/day: 1.00    Years: 2.00   Pack years: 2.00    Types: Cigarettes  . Smokeless tobacco: Never Used  . Tobacco comment: gave 1-800-quit now  Substance and Sexual Activity  . Alcohol use: No  . Drug use: No  . Sexual activity: Not on file  Other Topics Concern  . Not on file  Social History Narrative  . Not on file   Social Determinants of Health   Financial Resource Strain:   . Difficulty of Paying Living Expenses: Not on file  Food Insecurity:   . Worried About Charity fundraiser in the Last Year: Not on file  . Ran Out of Food in the Last Year: Not on file  Transportation Needs:   . Lack of Transportation (Medical): Not on file  . Lack of Transportation (Non-Medical): Not on file  Physical Activity:   . Days of Exercise per Week: Not on file  . Minutes of Exercise per Session: Not on file  Stress:   . Feeling of Stress : Not on file  Social Connections:   . Frequency of Communication with Friends and Family: Not on file  . Frequency of Social Gatherings with Friends and Family: Not on file  . Attends Religious Services: Not on file  . Active Member of Clubs or Organizations: Not on file  . Attends Archivist Meetings: Not on file  . Marital  Status: Not on file  Intimate Partner Violence:   . Fear of Current or Ex-Partner: Not on file  . Emotionally Abused: Not on file  . Physically Abused: Not on file  . Sexually Abused: Not on file   Review of Systems Appetite is off some No weight loss No fever Has never had sex    Objective:   Physical Exam  GI: Soft. He exhibits no distension. There is no abdominal tenderness. There is no rebound and no guarding.  Genitourinary:    Genitourinary Comments: Prostate slightly enlarged Normal median sulcus Moderate tenderness            Assessment & Plan:

## 2019-06-05 NOTE — Assessment & Plan Note (Signed)
Has had chronic problems in past---but nothing recently Seems to be brought on by caffeine--which is a significant irritant for him Urinalysis is benign--but can't be sure there isn't an infection component (since very tender) Will treat with tamusolin for 1 month, 2 weeks of septra, ibuprofen 800 tid for the next 1-2 weeks as well

## 2019-06-28 ENCOUNTER — Other Ambulatory Visit: Payer: Self-pay | Admitting: Internal Medicine

## 2019-07-01 ENCOUNTER — Other Ambulatory Visit: Payer: Self-pay | Admitting: Internal Medicine

## 2019-08-24 ENCOUNTER — Encounter: Payer: Self-pay | Admitting: Emergency Medicine

## 2019-08-24 ENCOUNTER — Other Ambulatory Visit: Payer: Self-pay

## 2019-08-24 ENCOUNTER — Emergency Department
Admission: EM | Admit: 2019-08-24 | Discharge: 2019-08-24 | Disposition: A | Discharge status: Home / Self Care | Payer: Self-pay | Attending: Emergency Medicine | Admitting: Emergency Medicine

## 2019-08-24 ENCOUNTER — Emergency Department: Payer: Self-pay

## 2019-08-24 DIAGNOSIS — I10 Essential (primary) hypertension: Secondary | ICD-10-CM | POA: Insufficient documentation

## 2019-08-24 DIAGNOSIS — F17210 Nicotine dependence, cigarettes, uncomplicated: Secondary | ICD-10-CM | POA: Insufficient documentation

## 2019-08-24 DIAGNOSIS — N200 Calculus of kidney: Secondary | ICD-10-CM | POA: Insufficient documentation

## 2019-08-24 DIAGNOSIS — Z79899 Other long term (current) drug therapy: Secondary | ICD-10-CM | POA: Insufficient documentation

## 2019-08-24 LAB — URINALYSIS, COMPLETE (UACMP) WITH MICROSCOPIC
Bacteria, UA: NONE SEEN
Bilirubin Urine: NEGATIVE
Glucose, UA: NEGATIVE mg/dL
Ketones, ur: NEGATIVE mg/dL
Leukocytes,Ua: NEGATIVE
Nitrite: NEGATIVE
Protein, ur: 30 mg/dL — AB
RBC / HPF: >50 RBC/hpf — ABNORMAL HIGH (ref 0–5)
Specific Gravity, Urine: 1.023 (ref 1.005–1.030)
Squamous Epithelial / HPF: NONE SEEN (ref 0–5)
pH: 6 (ref 5.0–8.0)

## 2019-08-24 LAB — CBC WITH DIFFERENTIAL/PLATELET
Abs Immature Granulocytes: 0.05 10*3/uL (ref 0.00–0.07)
Basophils Absolute: 0.1 10*3/uL (ref 0.0–0.1)
Basophils Relative: 1 %
Eosinophils Absolute: 0.6 10*3/uL — ABNORMAL HIGH (ref 0.0–0.5)
Eosinophils Relative: 4 %
HCT: 46.8 % (ref 39.0–52.0)
Hemoglobin: 15.4 g/dL (ref 13.0–17.0)
Immature Granulocytes: 0 %
Lymphocytes Relative: 35 %
Lymphs Abs: 4.5 10*3/uL — ABNORMAL HIGH (ref 0.7–4.0)
MCH: 29.6 pg (ref 26.0–34.0)
MCHC: 32.9 g/dL (ref 30.0–36.0)
MCV: 90 fL (ref 80.0–100.0)
Monocytes Absolute: 1.1 10*3/uL — ABNORMAL HIGH (ref 0.1–1.0)
Monocytes Relative: 8 %
Neutro Abs: 6.6 10*3/uL (ref 1.7–7.7)
Neutrophils Relative %: 52 %
Platelets: 256 10*3/uL (ref 150–400)
RBC: 5.2 MIL/uL (ref 4.22–5.81)
RDW: 13.2 % (ref 11.5–15.5)
WBC: 12.8 10*3/uL — ABNORMAL HIGH (ref 4.0–10.5)
nRBC: 0 % (ref 0.0–0.2)

## 2019-08-24 LAB — LIPASE, BLOOD: Lipase: 34 U/L (ref 11–51)

## 2019-08-24 LAB — COMPREHENSIVE METABOLIC PANEL
ALT: 14 U/L (ref 0–44)
AST: 23 U/L (ref 15–41)
Albumin: 4.3 g/dL (ref 3.5–5.0)
Alkaline Phosphatase: 77 U/L (ref 38–126)
Anion gap: 9 (ref 5–15)
BUN: 12 mg/dL (ref 6–20)
CO2: 27 mmol/L (ref 22–32)
Calcium: 9.2 mg/dL (ref 8.9–10.3)
Chloride: 105 mmol/L (ref 98–111)
Creatinine, Ser: 0.74 mg/dL (ref 0.61–1.24)
GFR calc Af Amer: >60 mL/min (ref >60)
GFR calc non Af Amer: >60 mL/min (ref >60)
Glucose, Bld: 128 mg/dL — ABNORMAL HIGH (ref 70–99)
Potassium: 3.8 mmol/L (ref 3.5–5.1)
Sodium: 141 mmol/L (ref 135–145)
Total Bilirubin: 1.1 mg/dL (ref 0.3–1.2)
Total Protein: 7.4 g/dL (ref 6.5–8.1)

## 2019-08-24 MED ORDER — ONDANSETRON HCL 4 MG/2ML IJ SOLN
4.0000 mg | Freq: Once | INTRAMUSCULAR | Status: AC
  Administered 2019-08-24: 05:00:00 4 mg via INTRAVENOUS
  Filled 2019-08-24: qty 2

## 2019-08-24 MED ORDER — OXYCODONE-ACETAMINOPHEN 5-325 MG PO TABS: 1.0000 | ORAL_TABLET | ORAL | 0 refills | Status: AC | PRN

## 2019-08-24 MED ORDER — ONDANSETRON HCL 4 MG/2ML IJ SOLN
INTRAMUSCULAR | Status: AC
  Administered 2019-08-24: 06:00:00 4 mg via INTRAVENOUS
  Filled 2019-08-24: qty 2

## 2019-08-24 MED ORDER — HYDROMORPHONE HCL 1 MG/ML IJ SOLN: 1.0000 mg | Freq: Once | INTRAMUSCULAR | Status: AC

## 2019-08-24 MED ORDER — ONDANSETRON HCL 4 MG/2ML IJ SOLN: 4.0000 mg | Freq: Once | INTRAMUSCULAR | Status: AC

## 2019-08-24 MED ORDER — HYDROMORPHONE HCL 1 MG/ML IJ SOLN
INTRAMUSCULAR | Status: AC
  Filled 2019-08-24: qty 1

## 2019-08-24 MED ORDER — SODIUM CHLORIDE 0.9 % IV BOLUS
1000.0000 mL | Freq: Once | INTRAVENOUS | Status: AC
  Administered 2019-08-24: 06:00:00 1000 mL via INTRAVENOUS

## 2019-08-24 MED ORDER — ONDANSETRON 4 MG PO TBDP: 4.0000 mg | ORAL_TABLET | Freq: Three times a day (TID) | ORAL | 0 refills | Status: DC | PRN

## 2019-08-24 MED ORDER — ONDANSETRON HCL 4 MG/2ML IJ SOLN
4.0000 mg | Freq: Once | INTRAMUSCULAR | Status: AC
  Administered 2019-08-24: 08:00:00 4 mg via INTRAVENOUS
  Filled 2019-08-24: qty 2

## 2019-08-24 MED ORDER — KETOROLAC TROMETHAMINE 30 MG/ML IJ SOLN
30.0000 mg | Freq: Once | INTRAMUSCULAR | Status: AC
  Administered 2019-08-24: 05:00:00 30 mg via INTRAVENOUS
  Filled 2019-08-24: qty 1

## 2019-08-24 MED ORDER — HYDROMORPHONE HCL 1 MG/ML IJ SOLN
1.0000 mg | Freq: Once | INTRAMUSCULAR | Status: AC
  Administered 2019-08-24: 1 mg via INTRAVENOUS

## 2019-08-24 MED ORDER — MORPHINE SULFATE (PF) 4 MG/ML IV SOLN: 4.0000 mg | Freq: Once | INTRAVENOUS | Status: DC

## 2019-08-24 MED ORDER — HYDROMORPHONE HCL 1 MG/ML IJ SOLN
INTRAMUSCULAR | Status: AC
  Administered 2019-08-24: 06:00:00 1 mg via INTRAVENOUS
  Filled 2019-08-24: qty 1

## 2019-08-24 NOTE — ED Triage Notes (Signed)
Pt to triage, appears very uncomfortable, grimacing; Pt st awoke at 3am with rt flank pain radiating into abd with no accomp symptoms; st hx kidney stones

## 2019-08-24 NOTE — ED Notes (Signed)
Pain still 10/10 to right flank, med given per EDP order.

## 2019-08-24 NOTE — ED Notes (Signed)
Assumed care of patient. Patient "reports feels better with pain 0/10, but physically feels like crap" patient with pale appearance and guarding abd. Urine obtained and sent, awaiting results and further plan of care.

## 2019-08-24 NOTE — ED Provider Notes (Signed)
[facility] Emergency Department Provider Note  ____________________________________________   First MD Initiated Contact with Patient 08/24/19 0532     (approximate)  I have reviewed the triage vital signs and the nursing notes.   HISTORY  Chief Complaint Flank Pain   HPI Sean Walters is a 34 y.o. male with below list of previous medical conditions including kidney stones presents to the emergency department secondary to 10 out of 10 acute onset of right flank pain with associated nausea that began at 3 AM this morning.  Patient denies any fever.  Patient denies any hematuria.  Patient denies any diarrhea or constipation.  Patient states that symptoms consistent with previous kidney stone.  Patient denies any aggravating or alleviating factors.        Past Medical History:  Diagnosis Date  . Chronic prostatitis   . Irritable bowel syndrome   . Other psoriasis   . Psoriatic arthritis ([place])    Dr Jefm Bryant  . Sacroiliitis [facility])     Patient Active Problem List   Diagnosis Date Noted  . Acute prostatitis 06/05/2019  . Strain of thoracic region 02/03/2019  . Dysphagia 02/03/2019  . Urinary urgency 01/30/2015  . Poison ivy dermatitis 10/16/2013  . Chronic prostatitis 04/05/2012  . Vasovagal syncope 02/22/2012  . IBS 11/26/2006  . TENSION HEADACHE 06/24/2006  . MIGRAINE, UNSPEC., W/O INTRACTABLE MIGRAINE 06/24/2006  . HYPERTENSION, BENIGN SYSTEMIC 06/24/2006  . SEBORRHEIC DERMATITIS, NOS 06/24/2006  . PSORIASIS 06/24/2006  . TACHYCARDIA 06/24/2006    Past Surgical History:  Procedure Laterality Date  . CYSTOSCOPY  [id number]   normal  . ESOPHAGOGASTRODUODENOSCOPY  [id number]  . US ECHOCARDIOGRAPHY  07/31/02    Prior to Admission medications   Medication Sig Start Date End Date Taking? Authorizing Provider  ibuprofen (ADVIL) 800 MG tablet TAKE 1 TABLET (800 MG TOTAL) BY MOUTH 3 (THREE) TIMES DAILY. FOR 1-2 WEEKS WITH MEALS. THEN USE IT AS  NEEDED 07/03/19   Venia Carbon, MD  sulfamethoxazole-trimethoprim (BACTRIM DS) 800-160 MG tablet Take 1 tablet by mouth 2 (two) times daily. 06/05/19   Venia Carbon, MD  tamsulosin (FLOMAX) 0.4 MG CAPS capsule Take 1 capsule (0.4 mg total) by mouth daily. 06/05/19   Venia Carbon, MD    Allergies Caffeine  Family History  Problem Relation Age of Onset  . Coronary artery disease Neg Hx   . Hypertension Neg Hx   . Diabetes Neg Hx   . Kidney cancer Neg Hx   . Prostate cancer Neg Hx     Social History Social History   Tobacco Use  . Smoking status: Current Every Day Smoker    Packs/day: 1.00    Years: 2.00    Pack years: 2.00    Types: Cigarettes  . Smokeless tobacco: Never Used  . Tobacco comment: gave 1-800-quit now  Substance Use Topics  . Alcohol use: No  . Drug use: No    Review of Systems Constitutional: No fever/chills Eyes: No visual changes. ENT: No sore throat. Cardiovascular: Denies chest pain. Respiratory: Denies shortness of breath. Gastrointestinal: No abdominal pain.  No nausea, no vomiting.  No diarrhea.  No constipation. Genitourinary: Negative for dysuria. Musculoskeletal: Negative for neck pain.  Negative for back pain. Integumentary: Negative for rash. Neurological: Negative for headaches, focal weakness or numbness.   ____________________________________________   PHYSICAL EXAM:  VITAL SIGNS: ED Triage Vitals  Enc Vitals Group     BP 08/24/19 0428 (!) 141/113  Pulse Rate 08/24/19 0428 99     Resp 08/24/19 0428 (!) 22     Temp 08/24/19 0428 98.7 F (37.1 C)     Temp Source 08/24/19 0428 Oral     SpO2 08/24/19 0428 100 %     Weight 08/24/19 0424 57.2 kg (126 lb)     Height 08/24/19 0424 1.778 m (5\' 10" )     Head Circumference --      Peak Flow --      Pain Score 08/24/19 0424 9     Pain Loc --      Pain Edu? --      Excl. in GC? --     Constitutional: Alert and oriented.  Apparent discomfort Eyes: Conjunctivae are  normal.  Mouth/Throat: Patient is wearing a mask. Neck: No stridor.  No meningeal signs.   Cardiovascular: Normal rate, regular rhythm. Good peripheral circulation. Grossly normal heart sounds. Respiratory: Normal respiratory effort.  No retractions. Gastrointestinal: Soft and nontender. No distention.  Musculoskeletal: No lower extremity tenderness nor edema. No gross deformities of extremities. Neurologic:  Normal speech and language. No gross focal neurologic deficits are appreciated.  Skin:  Skin is warm, dry and intact. Psychiatric: Mood and affect are normal. Speech and behavior are normal.  ____________________________________________   LABS (all labs ordered are listed, but only abnormal results are displayed)  Labs Reviewed  CBC WITH DIFFERENTIAL/PLATELET - Abnormal; Notable for the following components:      Result Value   WBC 12.8 (*)    Lymphs Abs 4.5 (*)    Monocytes Absolute 1.1 (*)    Eosinophils Absolute 0.6 (*)    All other components within normal limits  COMPREHENSIVE METABOLIC PANEL - Abnormal; Notable for the following components:   Glucose, Bld 128 (*)    All other components within normal limits  LIPASE, BLOOD  URINALYSIS, COMPLETE (UACMP) WITH MICROSCOPIC     RADIOLOGY I, Placedo N Legna Mausolf, personally viewed and evaluated these images (plain radiographs) as part of my medical decision making, as well as reviewing the written report by the radiologist.  ED MD interpretation: Right hydroureteronephrosis from a 2 mm UVJ calculus per radiologist.  Official radiology report(s): CT Renal Stone Study  Result Date: 08/24/2019 CLINICAL DATA:  Right flank pain EXAM: CT ABDOMEN AND PELVIS WITHOUT CONTRAST TECHNIQUE: Multidetector CT imaging of the abdomen and pelvis was performed following the standard protocol without IV contrast. COMPARISON:  09/05/2018 FINDINGS: Lower chest:  No contributory findings. Hepatobiliary: No focal liver abnormality.No evidence of  biliary obstruction or stone. Pancreas: Unremarkable. Spleen: Unremarkable. Adrenals/Urinary Tract: Negative adrenals. Mild right hydroureteronephrosis from a 2 mm stone at the UVJ. 2 right renal calculi measuring up to 4 mm on coronal reformats. There is a 3 mm stone at the upper pole left kidney with more inferior smaller and faint calculi. Unremarkable bladder. Stomach/Bowel:  No obstruction. No appendicitis. Vascular/Lymphatic: No acute vascular abnormality. No mass or adenopathy. Reproductive:Negative. Other: No ascites or pneumoperitoneum. Musculoskeletal: No acute abnormalities. Bilateral sacroiliac ankylosis. There is history of sacroiliitis per the chart IMPRESSION: 1. Right hydroureteronephrosis from a 2 mm UVJ calculus. 2. Bilateral nephrolithiasis. Electronically Signed   By: 11/05/2018 M.D.   On: 08/24/2019 05:11      Procedures   ____________________________________________   INITIAL IMPRESSION / MDM / ASSESSMENT AND PLAN / ED COURSE  As part of my medical decision making, I reviewed the following data within the electronic MEDICAL RECORD NUMBER   34 year old male presented with above-stated  history and physical exam a differential diagnosis including but not limited to ureterolithiasis, less likely pyelonephritis musculoskeletal etiology.  CT scan findings consistent with a 2 mm UVJ calculus with associated hydroureteronephrosis.  Patient was given IV Dilaudid 1 mg x 2 doses and IV Toradol 30 mg.  On reevaluation patient states that his pain is much improved at this time.  Awaiting urinalysis.  If UA negative patient will be discharged home with outpatient follow-up with urology.  Patient's care transferred to Dr. Derrill Kay  ____________________________________________  FINAL CLINICAL IMPRESSION(S) / ED DIAGNOSES  Final diagnoses:  Kidney stone     MEDICATIONS GIVEN DURING THIS VISIT:  Medications  ondansetron (ZOFRAN) injection 4 mg (4 mg Intravenous Given 08/24/19 0437)    HYDROmorphone (DILAUDID) injection 1 mg (1 mg Intravenous Given 08/24/19 0437)  ketorolac (TORADOL) 30 MG/ML injection 30 mg (30 mg Intravenous Given 08/24/19 0518)  HYDROmorphone (DILAUDID) injection 1 mg (1 mg Intravenous Given 08/24/19 0532)  ondansetron (ZOFRAN) injection 4 mg (4 mg Intravenous Given 08/24/19 0532)  sodium chloride 0.9 % bolus 1,000 mL (1,000 mLs Intravenous New Bag/Given 08/24/19 0532)     ED Discharge Orders    None      *Please note:  Sean Walters was evaluated in Emergency Department on 08/24/2019 for the symptoms described in the history of present illness. He was evaluated in the context of the global COVID-19 pandemic, which necessitated consideration that the patient might be at risk for infection with the SARS-CoV-2 virus that causes COVID-19. Institutional protocols and algorithms that pertain to the evaluation of patients at risk for COVID-19 are in a state of rapid change based on information released by regulatory bodies including the CDC and federal and state organizations. These policies and algorithms were followed during the patient's care in the ED.  Some ED evaluations and interventions [place] be delayed as a result of limited staffing during the pandemic.*  Note:  This document was prepared using Dragon voice recognition software and [place]owski include unintentional dictation errors.   Darci Current, MD 08/24/19 [phone]

## 2019-08-24 NOTE — ED Notes (Signed)
Pt states pain down to 2/10 able to rest comfortably, awaiting urine specimen.

## 2019-08-24 NOTE — ED Notes (Signed)
Pt still packing, diaphoretic, pale and co severe right flank pale. EDP at bedside, meds given as ordered.

## 2019-08-24 NOTE — ED Notes (Signed)
Pt in with co acute onset of right flank pain that woke him up this morning, states has hx of kidney stones. States pain is similar to past kidney stone pain, states is having some burning on urination. Has vomited since onset of pain, no fever.

## 2019-08-25 LAB — URINE CULTURE

## 2020-05-14 IMAGING — CR ABDOMEN - 1 VIEW
1 series · 1 of 1 positions shown · non-contrast
Comparison: CT abdomen/pelvis dated 09/05/2018

CLINICAL DATA: Left flank pain, kidney stone

EXAM:
ABDOMEN - 1 VIEW

[abdomen kub]
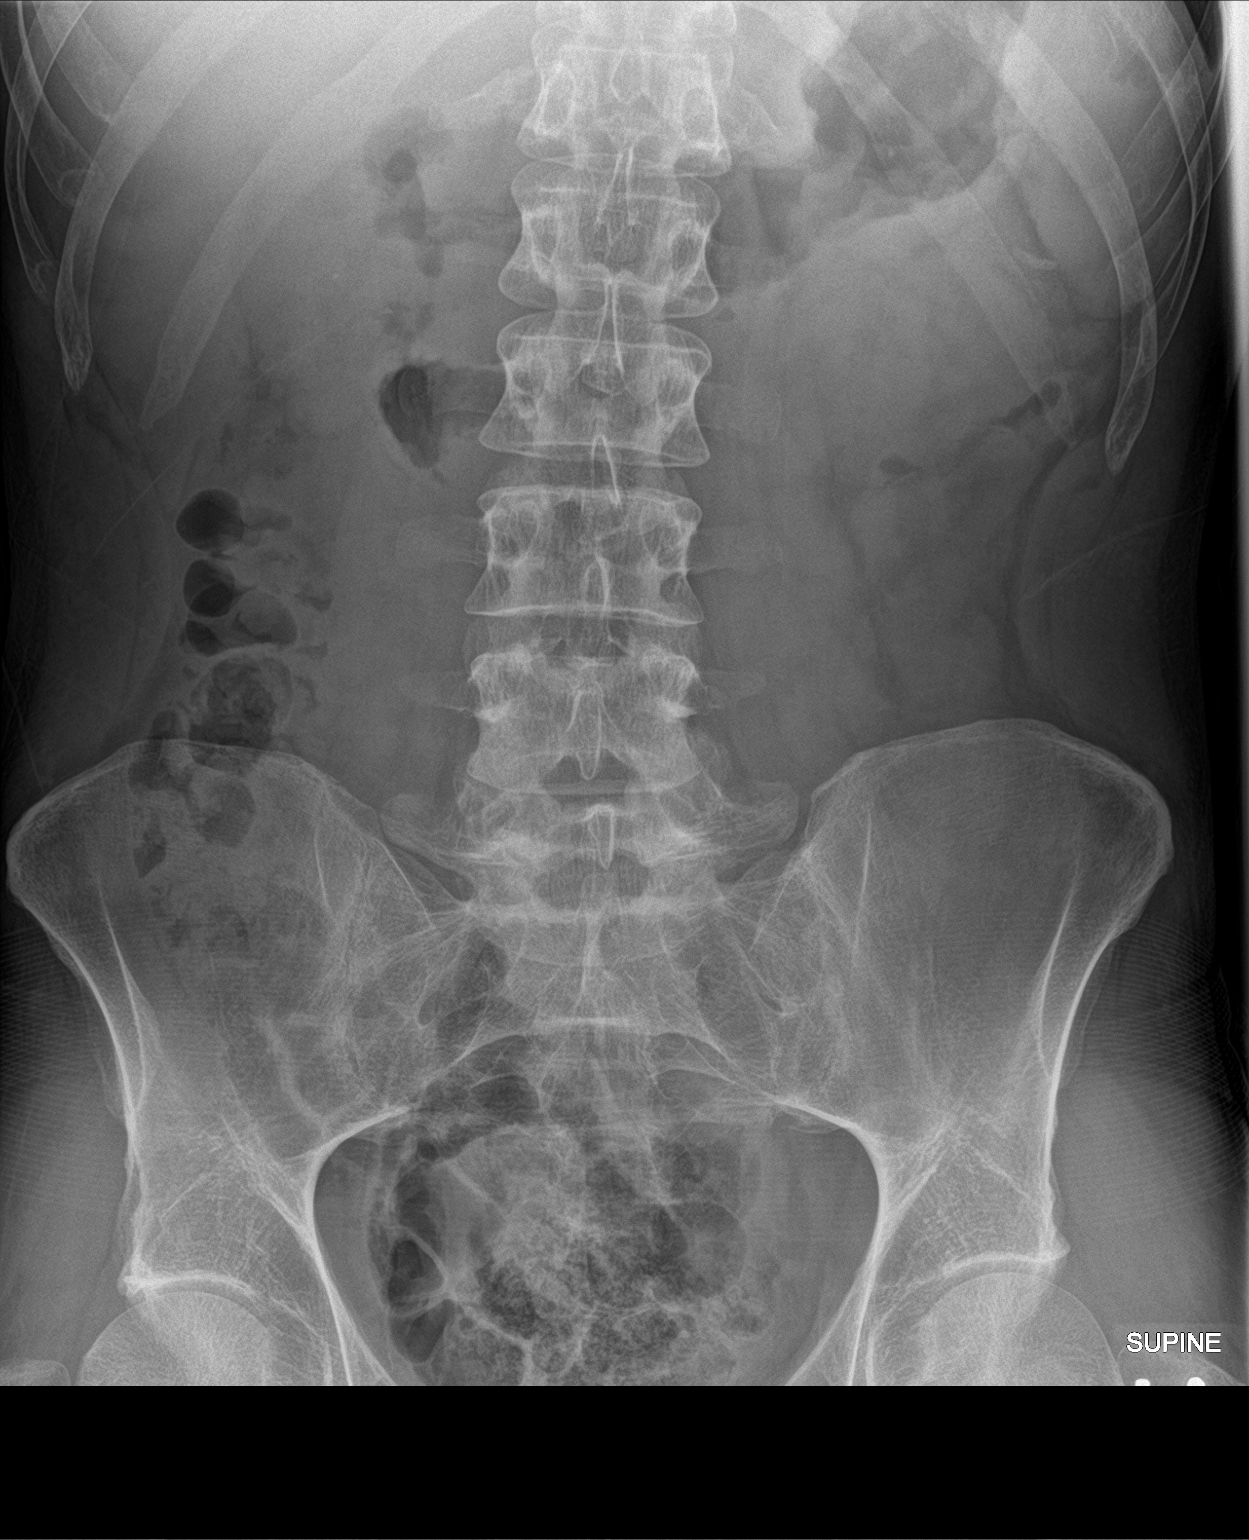

[1 of 1 positions shown; findings below may reference images not displayed]

FINDINGS: Known small left ureteral calculus on recent CT is not
radiographically evident.

3 mm calculus overlying the right kidney.

Nonobstructive bowel gas pattern.

Visualized osseous structures are within normal limits.
IMPRESSION: Known small left ureteral calculus on recent CT is not
radiographically evident.

3 mm calculus overlying the right kidney

## 2020-08-26 ENCOUNTER — Ambulatory Visit (INDEPENDENT_AMBULATORY_CARE_PROVIDER_SITE_OTHER): Payer: Self-pay | Admitting: Family Medicine

## 2020-08-26 ENCOUNTER — Other Ambulatory Visit: Payer: Self-pay

## 2020-08-26 VITALS — BP 120/82 | HR 113 | Temp 98.1°F | Ht 71.0 in | Wt 129.0 lb

## 2020-08-26 DIAGNOSIS — R339 Retention of urine, unspecified: Secondary | ICD-10-CM

## 2020-08-26 DIAGNOSIS — R310 Gross hematuria: Secondary | ICD-10-CM

## 2020-08-26 DIAGNOSIS — R3989 Other symptoms and signs involving the genitourinary system: Secondary | ICD-10-CM

## 2020-08-26 LAB — POC URINALSYSI DIPSTICK (AUTOMATED)
Bilirubin, UA: NEGATIVE
Blood, UA: POSITIVE
Glucose, UA: NEGATIVE
Ketones, UA: NEGATIVE
Nitrite, UA: NEGATIVE
Protein, UA: POSITIVE — AB
Spec Grav, UA: 1.015 (ref 1.010–1.025)
Urobilinogen, UA: 0.2 E.U./dL
pH, UA: 6.5 (ref 5.0–8.0)

## 2020-08-26 LAB — URINALYSIS, MICROSCOPIC ONLY

## 2020-08-26 NOTE — Progress Notes (Signed)
Subjective:     Sean Walters is a 35 y.o. male presenting for discolored urine (X 3 days )     HPI   #Discolored urine - hx of prostatitis in 05/2019 -- continues to have difficulty emptying the bladder - Kidney stone 08/24/2019 - discoloration 3 days ago - not with every urination - not sure if any blood in ejaculation - will get clumps but no blood - no urinary frequency - going 4-5 times a day - no worsening emptying the bladder -  Not sexually active  Endorses chronic athritis but rare use of ibuprofen  Does smoke No alcohol  Review of Systems  Gastrointestinal: Negative for abdominal pain, constipation, diarrhea, nausea and vomiting.  Genitourinary: Positive for difficulty urinating. Negative for dysuria, frequency, penile discharge, scrotal swelling, testicular pain and urgency.     Social History   Tobacco Use  Smoking Status Current Every Day Smoker  . Packs/day: 1.00  . Years: 2.00  . Pack years: 2.00  . Types: Cigarettes  Smokeless Tobacco Never Used  Tobacco Comment   gave 1-800-quit now        Objective:    BP Readings from Last 3 Encounters:  08/26/20 120/82  08/24/19 110/84  06/05/19 100/68   Wt Readings from Last 3 Encounters:  08/26/20 129 lb (58.5 kg)  08/24/19 126 lb (57.2 kg)  06/05/19 128 lb (58.1 kg)    BP 120/82   Pulse (!) 113   Temp 98.1 F (36.7 C) (Temporal)   Ht 5\' 11"  (1.803 m)   Wt 129 lb (58.5 kg)   SpO2 97%   BMI 17.99 kg/m    Physical Exam Constitutional:      Appearance: Normal appearance. He is not ill-appearing or diaphoretic.  HENT:     Right Ear: External ear normal.     Left Ear: External ear normal.  Eyes:     General: No scleral icterus.    Extraocular Movements: Extraocular movements intact.     Conjunctiva/sclera: Conjunctivae normal.  Cardiovascular:     Rate and Rhythm: Normal rate and regular rhythm.     Heart sounds: No murmur heard.   Pulmonary:     Effort: Pulmonary effort is  normal. No respiratory distress.     Breath sounds: Normal breath sounds. No wheezing.  Abdominal:     General: Abdomen is flat. Bowel sounds are normal. There is no distension.     Palpations: Abdomen is soft.     Tenderness: There is no abdominal tenderness. There is no guarding or rebound.  Musculoskeletal:     Cervical back: Neck supple.  Skin:    General: Skin is warm and dry.  Neurological:     Mental Status: He is alert. Mental status is at baseline.  Psychiatric:        Mood and Affect: Mood normal.           Assessment & Plan:   Problem List Items Addressed This Visit      Genitourinary   Gross hematuria - Primary    Pt with hx of kidney stones and prostatitis and presenting with 3 days hx of blood in the urine. Denies blood in ejaculate or other other symptoms of infection/stone. As asymptomatic will send for culture and micro to verify blood. If positive culture will treat for complicated UTI/prostatitis given hx. But discussed urology referral given persistence of retention and now blood in urine. He is a smoker but denies heavy alcohol use so some  risk factors for cancer if no other source identified.       Relevant Orders   Ambulatory referral to Urology   Urine Culture   Urine Microscopic    Other Visit Diagnoses    Urine discoloration       Relevant Orders   POCT Urinalysis Dipstick (Automated) (Completed)   Urinary retention       Relevant Orders   Ambulatory referral to Urology   Urine Culture   Urine Microscopic     >15 minutes was spent in chart review (prior visits for prostatitis), discussion and examination of patient and documentation.   Return if symptoms worsen or fail to improve.  Lynnda Child, MD  This visit occurred during the SARS-CoV-2 public health emergency.  Safety protocols were in place, including screening questions prior to the visit, additional usage of staff PPE, and extensive cleaning of exam room while observing  appropriate contact time as indicated for disinfecting solutions.

## 2020-08-26 NOTE — Patient Instructions (Addendum)
Call back if -- burning with urination -- worsening frequency, urgency, difficulty emptying -- fevers/chills, nausea/vomiting -- abdominal pain -- lightheadedness  #Referral I have placed a referral to a specialist for you. You should receive a phone call from the specialty office. Make sure your voicemail is not full and that if you are able to answer your phone to unknown or new numbers.   It [place] take up to 2 weeks to hear about the referral. If you do not hear anything in 2 weeks, please call our office and ask to speak with the referral coordinator.

## 2020-08-26 NOTE — Assessment & Plan Note (Signed)
Pt with hx of kidney stones and prostatitis and presenting with 3 days hx of blood in the urine. Denies blood in ejaculate or other other symptoms of infection/stone. As asymptomatic will send for culture and micro to verify blood. If positive culture will treat for complicated UTI/prostatitis given hx. But discussed urology referral given persistence of retention and now blood in urine. He is a smoker but denies heavy alcohol use so some risk factors for cancer if no other source identified.

## 2020-08-27 LAB — URINE CULTURE
MICRO NUMBER:: 11837989
Result:: NO GROWTH
SPECIMEN QUALITY:: ADEQUATE

## 2020-08-28 ENCOUNTER — Encounter: Payer: Self-pay | Admitting: Family Medicine

## 2020-09-01 ENCOUNTER — Emergency Department: Payer: Self-pay

## 2020-09-01 ENCOUNTER — Other Ambulatory Visit: Payer: Self-pay

## 2020-09-01 ENCOUNTER — Encounter: Payer: Self-pay | Admitting: Emergency Medicine

## 2020-09-01 ENCOUNTER — Emergency Department
Admission: EM | Admit: 2020-09-01 | Discharge: 2020-09-01 | Disposition: A | Discharge status: Home / Self Care | Payer: Self-pay | Attending: Emergency Medicine | Admitting: Emergency Medicine

## 2020-09-01 DIAGNOSIS — I10 Essential (primary) hypertension: Secondary | ICD-10-CM | POA: Insufficient documentation

## 2020-09-01 DIAGNOSIS — N200 Calculus of kidney: Secondary | ICD-10-CM

## 2020-09-01 DIAGNOSIS — N201 Calculus of ureter: Secondary | ICD-10-CM

## 2020-09-01 DIAGNOSIS — R109 Unspecified abdominal pain: Secondary | ICD-10-CM

## 2020-09-01 DIAGNOSIS — F17210 Nicotine dependence, cigarettes, uncomplicated: Secondary | ICD-10-CM | POA: Insufficient documentation

## 2020-09-01 LAB — CBC WITH DIFFERENTIAL/PLATELET
Abs Immature Granulocytes: 0.02 10*3/uL (ref 0.00–0.07)
Basophils Absolute: 0.1 10*3/uL (ref 0.0–0.1)
Basophils Relative: 1 %
Eosinophils Absolute: 0.3 10*3/uL (ref 0.0–0.5)
Eosinophils Relative: 3 %
HCT: 46.4 % (ref 39.0–52.0)
Hemoglobin: 15.4 g/dL (ref 13.0–17.0)
Immature Granulocytes: 0 %
Lymphocytes Relative: 20 %
Lymphs Abs: 1.9 10*3/uL (ref 0.7–4.0)
MCH: 29.8 pg (ref 26.0–34.0)
MCHC: 33.2 g/dL (ref 30.0–36.0)
MCV: 89.7 fL (ref 80.0–100.0)
Monocytes Absolute: 0.7 10*3/uL (ref 0.1–1.0)
Monocytes Relative: 7 %
Neutro Abs: 6.6 10*3/uL (ref 1.7–7.7)
Neutrophils Relative %: 69 %
Platelets: 236 10*3/uL (ref 150–400)
RBC: 5.17 MIL/uL (ref 4.22–5.81)
RDW: 12.6 % (ref 11.5–15.5)
WBC: 9.5 10*3/uL (ref 4.0–10.5)
nRBC: 0 % (ref 0.0–0.2)

## 2020-09-01 LAB — URINALYSIS, COMPLETE (UACMP) WITH MICROSCOPIC
Bilirubin Urine: NEGATIVE
Glucose, UA: NEGATIVE mg/dL
Ketones, ur: NEGATIVE mg/dL
Leukocytes,Ua: NEGATIVE
Nitrite: NEGATIVE
Protein, ur: NEGATIVE mg/dL
RBC / HPF: >50 RBC/hpf — ABNORMAL HIGH (ref 0–5)
Specific Gravity, Urine: 1.012 (ref 1.005–1.030)
Squamous Epithelial / HPF: NONE SEEN (ref 0–5)
pH: 7 (ref 5.0–8.0)

## 2020-09-01 LAB — COMPREHENSIVE METABOLIC PANEL
ALT: 11 U/L (ref 0–44)
AST: 17 U/L (ref 15–41)
Albumin: 4.5 g/dL (ref 3.5–5.0)
Alkaline Phosphatase: 63 U/L (ref 38–126)
Anion gap: 7 (ref 5–15)
BUN: 12 mg/dL (ref 6–20)
CO2: 27 mmol/L (ref 22–32)
Calcium: 9.6 mg/dL (ref 8.9–10.3)
Chloride: 105 mmol/L (ref 98–111)
Creatinine, Ser: 0.93 mg/dL (ref 0.61–1.24)
GFR, Estimated: >60 mL/min (ref >60)
Glucose, Bld: 101 mg/dL — ABNORMAL HIGH (ref 70–99)
Potassium: 4.4 mmol/L (ref 3.5–5.1)
Sodium: 139 mmol/L (ref 135–145)
Total Bilirubin: 0.9 mg/dL (ref 0.3–1.2)
Total Protein: 7.8 g/dL (ref 6.5–8.1)

## 2020-09-01 MED ORDER — ACETAMINOPHEN 500 MG PO TABS
1000.0000 mg | ORAL_TABLET | Freq: Once | ORAL | Status: AC
  Administered 2020-09-01: 1000 mg via ORAL
  Filled 2020-09-01: qty 2

## 2020-09-01 MED ORDER — KETOROLAC TROMETHAMINE 30 MG/ML IJ SOLN
15.0000 mg | Freq: Once | INTRAMUSCULAR | Status: AC
  Administered 2020-09-01: 15 mg via INTRAVENOUS
  Filled 2020-09-01: qty 1

## 2020-09-01 MED ORDER — NAPROXEN 500 MG PO TABS
500.0000 mg | ORAL_TABLET | Freq: Once | ORAL | Status: AC
  Administered 2020-09-01: 500 mg via ORAL
  Filled 2020-09-01: qty 1

## 2020-09-01 MED ORDER — LACTATED RINGERS IV BOLUS
1000.0000 mL | Freq: Once | INTRAVENOUS | Status: AC
  Administered 2020-09-01: 1000 mL via INTRAVENOUS

## 2020-09-01 MED ORDER — TAMSULOSIN HCL 0.4 MG PO CAPS: 0.4000 mg | ORAL_CAPSULE | Freq: Every day | ORAL | 0 refills | Status: DC

## 2020-09-01 MED ORDER — MORPHINE SULFATE (PF) 4 MG/ML IV SOLN
4.0000 mg | Freq: Once | INTRAVENOUS | Status: AC
  Administered 2020-09-01: 4 mg via INTRAVENOUS
  Filled 2020-09-01: qty 1

## 2020-09-01 MED ORDER — PHENAZOPYRIDINE HCL 100 MG PO TABS: 100.0000 mg | ORAL_TABLET | Freq: Three times a day (TID) | ORAL | 0 refills | Status: AC | PRN

## 2020-09-01 MED ORDER — ONDANSETRON HCL 4 MG/2ML IJ SOLN
4.0000 mg | Freq: Once | INTRAMUSCULAR | Status: AC
  Administered 2020-09-01: 4 mg via INTRAVENOUS
  Filled 2020-09-01: qty 2

## 2020-09-01 NOTE — ED Provider Notes (Signed)
[facility] Emergency Department Provider Note ____________________________________________   Event Date/Time   First MD Initiated Contact with Patient 09/01/20 [phone]     (approximate)  I have reviewed the triage vital signs and the nursing notes.  HISTORY  Chief Complaint Flank Pain and Nausea   HPI Sean Walters is a 35 y.o. malewho presents to the ED for evaluation of flank pain and nausea.  Chart review indicates history of ureteral stones  Patient presents to the ED for evaluation of right-sided flank pain that started yesterday, and reports it was intermittent for a few days prior to that.  Reports associated hematuria without dysuria, fever, emesis or syncope.  Currently reporting 8/10 intensity pain prior to medications, radiating to his right groin.   Past Medical History:  Diagnosis Date  . Chronic prostatitis   . Irritable bowel syndrome   . Other psoriasis   . Psoriatic arthritis (HCC)    Dr Gavin Potters  . Sacroiliitis [facility])     Patient Active Problem List   Diagnosis Date Noted  . Gross hematuria 08/26/2020  . Acute prostatitis 06/05/2019  . Strain of thoracic region 02/03/2019  . Dysphagia 02/03/2019  . Urinary urgency 01/30/2015  . Poison ivy dermatitis 10/16/2013  . Chronic prostatitis 04/05/2012  . Vasovagal syncope 02/22/2012  . IBS 11/26/2006  . TENSION HEADACHE 06/24/2006  . MIGRAINE, UNSPEC., W/O INTRACTABLE MIGRAINE 06/24/2006  . HYPERTENSION, BENIGN SYSTEMIC 06/24/2006  . SEBORRHEIC DERMATITIS, NOS 06/24/2006  . PSORIASIS 06/24/2006  . TACHYCARDIA 06/24/2006    Past Surgical History:  Procedure Laterality Date  . CYSTOSCOPY  [id number]   normal  . ESOPHAGOGASTRODUODENOSCOPY  [id number]  . US ECHOCARDIOGRAPHY  07/31/02    Prior to Admission medications   Medication Sig Start Date End Date Taking? Authorizing Provider  phenazopyridine (PYRIDIUM) 100 MG tablet Take 1 tablet (100 mg total) by mouth 3 (three) times daily  as needed for up to 2 days for pain. 09/01/20 09/03/20 Yes Delton Prairie, MD  tamsulosin (FLOMAX) 0.4 MG CAPS capsule Take 1 capsule (0.4 mg total) by mouth daily. 09/01/20  Yes Delton Prairie, MD  ibuprofen (ADVIL) 800 MG tablet TAKE 1 TABLET (800 MG TOTAL) BY MOUTH 3 (THREE) TIMES DAILY. FOR 1-2 WEEKS WITH MEALS. THEN USE IT AS NEEDED 07/03/19   Karie Schwalbe, MD  ondansetron (ZOFRAN ODT) 4 MG disintegrating tablet Take 1 tablet (4 mg total) by mouth every 8 (eight) hours as needed. 08/24/19   Darci Current, MD    Allergies Caffeine  Family History  Problem Relation Age of Onset  . Coronary artery disease Neg Hx   . Hypertension Neg Hx   . Diabetes Neg Hx   . Kidney cancer Neg Hx   . Prostate cancer Neg Hx     Social History Social History   Tobacco Use  . Smoking status: Current Every Day Smoker    Packs/day: 1.00    Years: 2.00    Pack years: 2.00    Types: Cigarettes  . Smokeless tobacco: Never Used  . Tobacco comment: gave 1-800-quit now  Substance Use Topics  . Alcohol use: No  . Drug use: No    Review of Systems  Constitutional: No fever/chills Eyes: No visual changes. ENT: No sore throat. Cardiovascular: Denies chest pain. Respiratory: Denies shortness of breath. Gastrointestinal: Positive for right-sided flank and abdominal pain with associated nausea  no vomiting.  No diarrhea.  No constipation. Genitourinary: Negative for dysuria. Musculoskeletal: Negative for back pain. Skin:  Negative for rash. Neurological: Negative for headaches, focal weakness or numbness.  ____________________________________________   PHYSICAL EXAM:  VITAL SIGNS: Vitals:   09/01/20 1100 09/01/20 1230  BP: 108/72 104/70  Pulse: 64 70  Resp: 18 17  Temp:    SpO2: 100% 98%     Constitutional: Alert and oriented.  Appears uncomfortable and is pacing the room. Eyes: Conjunctivae are normal. PERRL. EOMI. Head: Atraumatic. Nose: No congestion/rhinnorhea. Mouth/Throat: Mucous  membranes are moist.  Oropharynx non-erythematous. Neck: No stridor. No cervical spine tenderness to palpation. Cardiovascular: Normal rate, regular rhythm. Grossly normal heart sounds.  Good peripheral circulation. Respiratory: Normal respiratory effort.  No retractions. Lungs CTAB. Gastrointestinal: Soft , nondistended Mild and poorly localizing tenderness throughout the right-sided abdomen, right flank and right CVA.  No peritoneal signs.  Left-sided abdomen is benign. Musculoskeletal: No lower extremity tenderness nor edema.  No joint effusions. No signs of acute trauma. Neurologic:  Normal speech and language. No gross focal neurologic deficits are appreciated. No gait instability noted. Skin:  Skin is warm, dry and intact. No rash noted. Psychiatric: Mood and affect are normal. Speech and behavior are normal.  ____________________________________________   LABS (all labs ordered are listed, but only abnormal results are displayed)  Labs Reviewed  COMPREHENSIVE METABOLIC PANEL - Abnormal; Notable for the following components:      Result Value   Glucose, Bld 101 (*)    All other components within normal limits  URINALYSIS, COMPLETE (UACMP) WITH MICROSCOPIC - Abnormal; Notable for the following components:   Color, Urine YELLOW (*)    APPearance CLEAR (*)    Hgb urine dipstick MODERATE (*)    RBC / HPF >50 (*)    Bacteria, UA RARE (*)    All other components within normal limits  CBC WITH DIFFERENTIAL/PLATELET    ____________________________________________  RADIOLOGY  ED MD interpretation: CT renal study reviewed by me with distal right-sided ureteral stone with minimal to no hydronephrosis  Official radiology report(s): CT Renal Stone Study  Result Date: 09/01/2020 CLINICAL DATA:  Right-sided flank pain. EXAM: CT ABDOMEN AND PELVIS WITHOUT CONTRAST TECHNIQUE: Multidetector CT imaging of the abdomen and pelvis was performed following the standard protocol without IV  contrast. COMPARISON:  CT stone study 08/24/2019. FINDINGS: Lower chest: Minimal atelectasis is present at the lung bases. Heart size is normal. No significant pleural or pericardial effusion is present. Hepatobiliary: No focal liver abnormality is seen. No gallstones, gallbladder wall thickening, or biliary dilatation. Pancreas: Unremarkable. No pancreatic ductal dilatation or surrounding inflammatory changes. Spleen: Normal in size without focal abnormality. Adrenals/Urinary Tract: Adrenal glands are within normal limits bilaterally. The nonobstructing stone at the upper pole of the right kidney is stable. 4 mm stone closer to the lower pole is no longer present. 3 mm left upper pole nonobstructing stone is stable. Slight increased prominence of other nonobstructing left-sided stones. A 5 mm stone is present at the right UPJ. Ureter is not dilated above this level. No hydronephrosis is present. Left ureter is unremarkable. The urinary bladder is within normal limits. Stomach/Bowel: Stomach and duodenum are within normal limits. Small bowel is unremarkable. Terminal ileum is normal. The ascending and transverse colon are within normal limits. Descending and sigmoid colon are normal. Vascular/Lymphatic: No significant vascular findings are present. No enlarged abdominal or pelvic lymph nodes. Reproductive: Prostate is unremarkable. Other: No abdominal wall hernia or abnormality. No abdominopelvic ascites. Musculoskeletal: Superior endplate Schmorl's node present at L4. Vertebral body heights and alignment are normal otherwise. SI joints  are fused bilaterally. No ankylosis evident in the lumbar spine. Hips are located and within normal limits. IMPRESSION: 1. 5 mm stone at the right UPJ without significant hydronephrosis. 2. Additional nonobstructing stones in both kidneys are stable. 3. Fusion of the SI joints bilaterally. Question ankylosing spondylitis. Electronically Signed   By: Marin Roberts M.D.   On:  09/01/2020 11:44    ____________________________________________   PROCEDURES and INTERVENTIONS  Procedure(s) performed (including Critical Care):  .1-3 Lead EKG Interpretation Performed by: Delton Prairie, MD Authorized by: Delton Prairie, MD     Interpretation: normal     ECG rate:  70   ECG rate assessment: normal     Rhythm: sinus rhythm     Ectopy: none     Conduction: normal      Medications  ketorolac (TORADOL) 30 MG/ML injection 15 mg (15 mg Intravenous Given 09/01/20 0852)  lactated ringers bolus 1,000 mL (0 mLs Intravenous Stopped 09/01/20 1311)  ondansetron (ZOFRAN) injection 4 mg (4 mg Intravenous Given 09/01/20 0852)  morphine 4 MG/ML injection 4 mg (4 mg Intravenous Given 09/01/20 0937)  ketorolac (TORADOL) 30 MG/ML injection 15 mg (15 mg Intravenous Given 09/01/20 1218)  acetaminophen (TYLENOL) tablet 1,000 mg (1,000 mg Oral Given 09/01/20 1255)  naproxen (NAPROSYN) tablet 500 mg (500 mg Oral Given 09/01/20 1255)    ____________________________________________   MDM / ED COURSE   35 year old male with history of renal stones presents to the ED with ureteral colic, with evidence of distal right-sided stone, that he clinically passes while in the ED and is amenable to outpatient management.  Normal vitals on room air.  Exam demonstrates an uncomfortable-appearing patient with minimal abdominal tenderness.  No distress.  Blood work is benign with intact renal function and no leukocytosis.  UA with hematuria without leukocytes or nitrites to suggest coexisting infection or pyelonephritis.  Due to his continued pain despite Toradol and morphine, CT renal study obtained and demonstrates distal ureteral stone on the right.  After another dose of Toradol, he clinically passes the stone and appears better.  Tolerating p.o. intake.  Provided tamsulosin, and Pyridium at his request, until his already-scheduled follow-up with urology as an outpatient in 1 month.  We discussed return  precautions for the ED in the meantime.  Clinical Course as of 09/01/20 1408  Sun Muccio 08, 2022  1054 Reassess. Pt reports persistent pain. We discussed another dose toradol and CT imaging, he is agreeable [DS]  1215 Reassessed.  Patient ports feeling better.  We discussed clinical passage of stone as it was at the UPJ during CT scan.  We discussed following up with urology.  He requests Pyridium.  Discussed Flomax as well.  He is agreeable [DS]  1248 Tolerated PO.  Indicating some mild residual "achiness.  "We will provide Tylenol and naproxen prior to discharge. [DS]    Clinical Course User Index [DS] Delton Prairie, MD    ____________________________________________   FINAL CLINICAL IMPRESSION(S) / ED DIAGNOSES  Final diagnoses:  Ureterolithiasis  Right flank pain  Nephrolithiasis     ED Discharge Orders         Ordered    tamsulosin (FLOMAX) 0.4 MG CAPS capsule  Daily        09/01/20 1218    phenazopyridine (PYRIDIUM) 100 MG tablet  3 times daily PRN        09/01/20 1218           Kaliann Coryell   Note:  This document was prepared using  Dragon voice recognition softwareChemical engineer and Surita include unintentional dictation errors.   Delton PrairieSmith, Maryjane Benedict, MD 09/01/20 [phone]

## 2020-09-01 NOTE — ED Notes (Signed)
D/C and new RX discussed with pt, pt verbalzied understanding. NAD noted. Pt ambulatory on D/C with steady gait.

## 2020-09-01 NOTE — ED Triage Notes (Signed)
Pt reports right flank pain since yesterday. Pt states pain radiates around his abd and down into his private area. Pt reports hx of kidney stones and states this feels the same.

## 2020-09-01 NOTE — ED Notes (Signed)
Pt presents to ED with c/o of R flank pain that radiates to his groin. Pt states HX of kidney stones. Pt states recently going to PCP for complaint of flank pain and UA was obtained there and per pt he was told he had blood in his urine. Pt states his flank wasn't hurting yesterday but started today. Pt denies issues with being able to urinate. Pt denies fevers or chills but appears uncomfortable and request to stand due to this easing his pain. NAD noted at this time. VSS.

## 2020-09-01 NOTE — ED Notes (Signed)
Pt refusing cardiac monitoring at this time due to "not wanting to be completely tied down". Pt is agreeable that if he can lay down after getting pain medication and is comfortable enough he is agreeable to cardiac monitoring at that time, MD aware.

## 2020-09-01 NOTE — Discharge Instructions (Signed)
Use Tylenol for pain and fevers.  Up to 1000 mg per dose, up to 4 times per day.  Do not take more than 4000 mg of Tylenol/acetaminophen within 24 hours.. Use naproxen/Aleve for anti-inflammatory pain relief. Use up to 500mg  every 12 hours. Do not take more frequently than this. Do not use other NSAIDs (ibuprofen, Advil) while taking this medication. It is safe to take Tylenol with this.   Stay hydrated and follow the associated dietary guidelines to prevent further stones. Take Pyridium for the next couple days and use Flomax daily moving forward until you can see your urologist.  Return to the ED with any further worsening symptoms or fevers with your symptoms despite these measures.

## 2020-09-01 NOTE — ED Notes (Signed)
Pt at CT

## 2020-09-02 ENCOUNTER — Ambulatory Visit
Admission: EM | Admit: 2020-09-02 | Discharge: 2020-09-02 | Disposition: A | Discharge status: Home / Self Care | Payer: Self-pay | Attending: Urology | Admitting: Urology

## 2020-09-02 ENCOUNTER — Emergency Department: Payer: Self-pay | Admitting: Registered Nurse

## 2020-09-02 ENCOUNTER — Encounter
Admission: EM | Disposition: A | Discharge status: Home / Self Care | Payer: Self-pay | Source: Home / Self Care | Attending: Emergency Medicine

## 2020-09-02 ENCOUNTER — Other Ambulatory Visit: Payer: Self-pay

## 2020-09-02 ENCOUNTER — Emergency Department: Payer: Self-pay

## 2020-09-02 DIAGNOSIS — N201 Calculus of ureter: Secondary | ICD-10-CM | POA: Diagnosis present

## 2020-09-02 DIAGNOSIS — N202 Calculus of kidney with calculus of ureter: Secondary | ICD-10-CM | POA: Insufficient documentation

## 2020-09-02 DIAGNOSIS — Z79899 Other long term (current) drug therapy: Secondary | ICD-10-CM | POA: Insufficient documentation

## 2020-09-02 DIAGNOSIS — Z20822 Contact with and (suspected) exposure to covid-19: Secondary | ICD-10-CM | POA: Insufficient documentation

## 2020-09-02 DIAGNOSIS — K589 Irritable bowel syndrome without diarrhea: Secondary | ICD-10-CM | POA: Diagnosis present

## 2020-09-02 DIAGNOSIS — F17210 Nicotine dependence, cigarettes, uncomplicated: Secondary | ICD-10-CM | POA: Insufficient documentation

## 2020-09-02 DIAGNOSIS — Z791 Long term (current) use of non-steroidal anti-inflammatories (NSAID): Secondary | ICD-10-CM | POA: Insufficient documentation

## 2020-09-02 DIAGNOSIS — I10 Essential (primary) hypertension: Secondary | ICD-10-CM | POA: Insufficient documentation

## 2020-09-02 DIAGNOSIS — N200 Calculus of kidney: Secondary | ICD-10-CM

## 2020-09-02 DIAGNOSIS — L4050 Arthropathic psoriasis, unspecified: Secondary | ICD-10-CM | POA: Insufficient documentation

## 2020-09-02 DIAGNOSIS — Z87442 Personal history of urinary calculi: Secondary | ICD-10-CM | POA: Insufficient documentation

## 2020-09-02 HISTORY — PX: CYSTOSCOPY/URETEROSCOPY/HOLMIUM LASER/STENT PLACEMENT: SHX6546

## 2020-09-02 LAB — CBC
HCT: 43.5 % (ref 39.0–52.0)
Hemoglobin: 14.5 g/dL (ref 13.0–17.0)
MCH: 30 pg (ref 26.0–34.0)
MCHC: 33.3 g/dL (ref 30.0–36.0)
MCV: 90.1 fL (ref 80.0–100.0)
Platelets: 204 10*3/uL (ref 150–400)
RBC: 4.83 MIL/uL (ref 4.22–5.81)
RDW: 12.6 % (ref 11.5–15.5)
WBC: 8.5 10*3/uL (ref 4.0–10.5)
nRBC: 0 % (ref 0.0–0.2)

## 2020-09-02 LAB — RESP PANEL BY RT-PCR (FLU A&B, COVID) ARPGX2
Influenza A by PCR: NEGATIVE
Influenza B by PCR: NEGATIVE
SARS Coronavirus 2 by RT PCR: NEGATIVE

## 2020-09-02 LAB — BASIC METABOLIC PANEL
Anion gap: 8 (ref 5–15)
BUN: 17 mg/dL (ref 6–20)
CO2: 26 mmol/L (ref 22–32)
Calcium: 9.3 mg/dL (ref 8.9–10.3)
Chloride: 106 mmol/L (ref 98–111)
Creatinine, Ser: 1.09 mg/dL (ref 0.61–1.24)
GFR, Estimated: >60 mL/min (ref >60)
Glucose, Bld: 143 mg/dL — ABNORMAL HIGH (ref 70–99)
Potassium: 4 mmol/L (ref 3.5–5.1)
Sodium: 140 mmol/L (ref 135–145)

## 2020-09-02 LAB — URINALYSIS, COMPLETE (UACMP) WITH MICROSCOPIC
Bacteria, UA: NONE SEEN
RBC / HPF: >50 RBC/hpf — ABNORMAL HIGH (ref 0–5)
Specific Gravity, Urine: 1.016 (ref 1.005–1.030)
Squamous Epithelial / HPF: NONE SEEN (ref 0–5)

## 2020-09-02 LAB — CBG MONITORING, ED: Glucose-Capillary: 85 mg/dL (ref 70–99)

## 2020-09-02 SURGERY — CYSTOSCOPY/URETEROSCOPY/HOLMIUM LASER/STENT PLACEMENT
Anesthesia: General | Site: Ureter | Laterality: Right

## 2020-09-02 MED ORDER — DEXAMETHASONE SODIUM PHOSPHATE 10 MG/ML IJ SOLN
INTRAMUSCULAR | Status: AC
  Filled 2020-09-02: qty 1

## 2020-09-02 MED ORDER — FENTANYL CITRATE (PF) 100 MCG/2ML IJ SOLN
INTRAMUSCULAR | Status: DC | PRN
  Administered 2020-09-02 (×2): 50 ug via INTRAVENOUS

## 2020-09-02 MED ORDER — HYDROMORPHONE HCL 1 MG/ML IJ SOLN
1.0000 mg | Freq: Once | INTRAMUSCULAR | Status: AC
  Administered 2020-09-02: 1 mg via INTRAVENOUS
  Filled 2020-09-02: qty 1

## 2020-09-02 MED ORDER — BELLADONNA ALKALOIDS-OPIUM 16.2-60 MG RE SUPP
RECTAL | Status: DC | PRN
  Administered 2020-09-02: 1 via RECTAL

## 2020-09-02 MED ORDER — ACETAMINOPHEN 500 MG PO TABS
1000.0000 mg | ORAL_TABLET | Freq: Once | ORAL | Status: AC
  Administered 2020-09-02: 1000 mg via ORAL
  Filled 2020-09-02: qty 2

## 2020-09-02 MED ORDER — LIDOCAINE HCL (PF) 2 % IJ SOLN
INTRAMUSCULAR | Status: AC
  Filled 2020-09-02: qty 5

## 2020-09-02 MED ORDER — MIDAZOLAM HCL 2 MG/2ML IJ SOLN
INTRAMUSCULAR | Status: DC | PRN
  Administered 2020-09-02: 2 mg via INTRAVENOUS

## 2020-09-02 MED ORDER — HYDROCODONE-ACETAMINOPHEN 5-325 MG PO TABS: 1.0000 | ORAL_TABLET | Freq: Four times a day (QID) | ORAL | 0 refills | Status: AC | PRN

## 2020-09-02 MED ORDER — LIDOCAINE HCL URETHRAL/MUCOSAL 2 % EX GEL
CUTANEOUS | Status: AC
  Filled 2020-09-02: qty 10

## 2020-09-02 MED ORDER — PROPOFOL 10 MG/ML IV BOLUS
INTRAVENOUS | Status: DC | PRN
  Administered 2020-09-02: 150 mg via INTRAVENOUS

## 2020-09-02 MED ORDER — SODIUM CHLORIDE 0.9 % IV SOLN
1.5000 mg/kg | Freq: Once | INTRAVENOUS | Status: AC
  Administered 2020-09-02: 88 mg via INTRAVENOUS
  Filled 2020-09-02: qty 4.4

## 2020-09-02 MED ORDER — DEXAMETHASONE SODIUM PHOSPHATE 10 MG/ML IJ SOLN
INTRAMUSCULAR | Status: DC | PRN
  Administered 2020-09-02: 10 mg via INTRAVENOUS

## 2020-09-02 MED ORDER — LACTATED RINGERS IV BOLUS
1000.0000 mL | Freq: Once | INTRAVENOUS | Status: AC
  Administered 2020-09-02: 1000 mL via INTRAVENOUS

## 2020-09-02 MED ORDER — ONDANSETRON HCL 4 MG/2ML IJ SOLN
INTRAMUSCULAR | Status: AC
  Filled 2020-09-02: qty 2

## 2020-09-02 MED ORDER — FENTANYL CITRATE (PF) 100 MCG/2ML IJ SOLN
INTRAMUSCULAR | Status: AC
  Filled 2020-09-02: qty 2

## 2020-09-02 MED ORDER — KETOROLAC TROMETHAMINE 30 MG/ML IJ SOLN
INTRAMUSCULAR | Status: DC | PRN
  Administered 2020-09-02: 15 mg via INTRAVENOUS

## 2020-09-02 MED ORDER — KETOROLAC TROMETHAMINE 30 MG/ML IJ SOLN
INTRAMUSCULAR | Status: AC
  Filled 2020-09-02: qty 1

## 2020-09-02 MED ORDER — IOHEXOL 180 MG/ML  SOLN
INTRAMUSCULAR | Status: DC | PRN
  Administered 2020-09-02: 20 mL

## 2020-09-02 MED ORDER — ROCURONIUM BROMIDE 10 MG/ML (PF) SYRINGE
PREFILLED_SYRINGE | INTRAVENOUS | Status: AC
  Filled 2020-09-02: qty 10

## 2020-09-02 MED ORDER — KETOROLAC TROMETHAMINE 30 MG/ML IJ SOLN
30.0000 mg | Freq: Four times a day (QID) | INTRAMUSCULAR | Status: DC | PRN
  Administered 2020-09-02: 30 mg via INTRAVENOUS
  Filled 2020-09-02: qty 1

## 2020-09-02 MED ORDER — LACTATED RINGERS IV BOLUS
500.0000 mL | Freq: Once | INTRAVENOUS | Status: AC
  Administered 2020-09-02: 500 mL via INTRAVENOUS

## 2020-09-02 MED ORDER — ONDANSETRON HCL 4 MG/2ML IJ SOLN
4.0000 mg | Freq: Once | INTRAMUSCULAR | Status: AC
  Administered 2020-09-02: 4 mg via INTRAVENOUS
  Filled 2020-09-02: qty 2

## 2020-09-02 MED ORDER — ONDANSETRON HCL 4 MG/2ML IJ SOLN: 4.0000 mg | Freq: Once | INTRAMUSCULAR | Status: AC

## 2020-09-02 MED ORDER — MIDAZOLAM HCL 2 MG/2ML IJ SOLN
INTRAMUSCULAR | Status: AC
  Filled 2020-09-02: qty 2

## 2020-09-02 MED ORDER — OXYCODONE HCL 5 MG PO TABS
5.0000 mg | ORAL_TABLET | ORAL | Status: AC
  Administered 2020-09-02: 5 mg via ORAL
  Filled 2020-09-02: qty 1

## 2020-09-02 MED ORDER — LIDOCAINE HCL (CARDIAC) PF 100 MG/5ML IV SOSY
PREFILLED_SYRINGE | INTRAVENOUS | Status: DC | PRN
  Administered 2020-09-02: 60 mg via INTRAVENOUS

## 2020-09-02 MED ORDER — ONDANSETRON HCL 4 MG PO TABS: 4.0000 mg | ORAL_TABLET | Freq: Three times a day (TID) | ORAL | 0 refills | Status: AC | PRN

## 2020-09-02 MED ORDER — KETOROLAC TROMETHAMINE 30 MG/ML IJ SOLN
30.0000 mg | Freq: Once | INTRAMUSCULAR | Status: AC
  Administered 2020-09-02: 30 mg via INTRAVENOUS
  Filled 2020-09-02: qty 1

## 2020-09-02 MED ORDER — PROPOFOL 10 MG/ML IV BOLUS
INTRAVENOUS | Status: AC
  Filled 2020-09-02: qty 80

## 2020-09-02 MED ORDER — SODIUM CHLORIDE 0.9 % IV SOLN
1.0000 g | Freq: Once | INTRAVENOUS | Status: AC
  Administered 2020-09-02: 1 g via INTRAVENOUS
  Filled 2020-09-02: qty 1

## 2020-09-02 MED ORDER — SUGAMMADEX SODIUM 200 MG/2ML IV SOLN
INTRAVENOUS | Status: DC | PRN
  Administered 2020-09-02: 150 mg via INTRAVENOUS

## 2020-09-02 MED ORDER — CEFAZOLIN (ANCEF) 1 G IV SOLR: 1.0000 g | INTRAVENOUS | Status: DC

## 2020-09-02 MED ORDER — HYDROMORPHONE HCL 1 MG/ML IJ SOLN
0.5000 mg | INTRAMUSCULAR | Status: DC | PRN
  Administered 2020-09-02: 0.5 mg via INTRAVENOUS
  Filled 2020-09-02: qty 1

## 2020-09-02 MED ORDER — FENTANYL CITRATE (PF) 100 MCG/2ML IJ SOLN: 25.0000 ug | INTRAMUSCULAR | Status: DC | PRN

## 2020-09-02 MED ORDER — LACTATED RINGERS IV SOLN: INTRAVENOUS | Status: DC | PRN

## 2020-09-02 MED ORDER — ROCURONIUM BROMIDE 100 MG/10ML IV SOLN
INTRAVENOUS | Status: DC | PRN
  Administered 2020-09-02: 40 mg via INTRAVENOUS
  Administered 2020-09-02: 10 mg via INTRAVENOUS

## 2020-09-02 MED ORDER — METOCLOPRAMIDE HCL 5 MG/ML IJ SOLN
INTRAMUSCULAR | Status: AC
  Filled 2020-09-02: qty 2

## 2020-09-02 MED ORDER — ONDANSETRON HCL 4 MG/2ML IJ SOLN
INTRAMUSCULAR | Status: AC
  Administered 2020-09-02: 4 mg via INTRAVENOUS
  Filled 2020-09-02: qty 2

## 2020-09-02 MED ORDER — KETOROLAC TROMETHAMINE 10 MG PO TABS: 10.0000 mg | ORAL_TABLET | Freq: Four times a day (QID) | ORAL | 0 refills | Status: DC | PRN

## 2020-09-02 MED ORDER — ONDANSETRON HCL 4 MG/2ML IJ SOLN
4.0000 mg | Freq: Four times a day (QID) | INTRAMUSCULAR | Status: DC | PRN
  Administered 2020-09-02 (×2): 4 mg via INTRAVENOUS
  Filled 2020-09-02: qty 2

## 2020-09-02 MED ORDER — METOCLOPRAMIDE HCL 5 MG/ML IJ SOLN
10.0000 mg | Freq: Once | INTRAMUSCULAR | Status: AC
  Administered 2020-09-02: 10 mg via INTRAVENOUS

## 2020-09-02 MED ORDER — LIDOCAINE HCL URETHRAL/MUCOSAL 2 % EX GEL
CUTANEOUS | Status: DC | PRN
  Administered 2020-09-02: 1

## 2020-09-02 MED ORDER — ONDANSETRON HCL 4 MG/2ML IJ SOLN: 4.0000 mg | Freq: Once | INTRAMUSCULAR | Status: DC | PRN

## 2020-09-02 MED ORDER — BELLADONNA ALKALOIDS-OPIUM 16.2-60 MG RE SUPP
RECTAL | Status: AC
  Filled 2020-09-02: qty 1

## 2020-09-02 MED ORDER — HYDROMORPHONE HCL 1 MG/ML IJ SOLN
0.5000 mg | Freq: Once | INTRAMUSCULAR | Status: AC
  Administered 2020-09-02: 0.5 mg via INTRAVENOUS
  Filled 2020-09-02: qty 1

## 2020-09-02 SURGICAL SUPPLY — 32 items
ADH LQ OCL WTPRF AMP STRL LF (MISCELLANEOUS) ×1
ADHESIVE MASTISOL STRL (MISCELLANEOUS) ×1 IMPLANT
BAG DRAIN CYSTO-URO LG1000N (MISCELLANEOUS) ×2 IMPLANT
BRUSH SCRUB EZ 1% IODOPHOR (MISCELLANEOUS) ×2 IMPLANT
CATH URET FLEX-TIP 2 LUMEN 10F (CATHETERS) IMPLANT
CATH URETL 5X70 OPEN END (CATHETERS) IMPLANT
CNTNR SPEC 2.5X3XGRAD LEK (MISCELLANEOUS)
CONT SPEC 4OZ STER OR WHT (MISCELLANEOUS)
CONT SPEC 4OZ STRL OR WHT (MISCELLANEOUS)
CONTAINER SPEC 2.5X3XGRAD LEK (MISCELLANEOUS) IMPLANT
DRAPE UTILITY 15X26 TOWEL STRL (DRAPES) ×2 IMPLANT
DRSG TEGADERM 2-3/8X2-3/4 SM (GAUZE/BANDAGES/DRESSINGS) ×2 IMPLANT
DRSG TEGADERM 2X2.25 PEDS (GAUZE/BANDAGES/DRESSINGS) ×2 IMPLANT
GLOVE SURG UNDER POLY LF SZ7.5 (GLOVE) ×2 IMPLANT
GOWN STRL REUS W/ TWL LRG LVL3 (GOWN DISPOSABLE) ×1 IMPLANT
GOWN STRL REUS W/ TWL XL LVL3 (GOWN DISPOSABLE) ×1 IMPLANT
GOWN STRL REUS W/TWL LRG LVL3 (GOWN DISPOSABLE) ×2
GOWN STRL REUS W/TWL XL LVL3 (GOWN DISPOSABLE) ×2
GUIDEWIRE STR DUAL SENSOR (WIRE) ×4 IMPLANT
INFUSOR MANOMETER BAG 3000ML (MISCELLANEOUS) ×2 IMPLANT
IV NS IRRIG 3000ML ARTHROMATIC (IV SOLUTION) ×2 IMPLANT
KIT TURNOVER CYSTO (KITS) ×2 IMPLANT
PACK CYSTO AR (MISCELLANEOUS) ×2 IMPLANT
SET CYSTO W/LG BORE CLAMP LF (SET/KITS/TRAYS/PACK) ×2 IMPLANT
SHEATH URETERAL 12FRX35CM (MISCELLANEOUS) IMPLANT
STENT URET 6FRX24 CONTOUR (STENTS) IMPLANT
STENT URET 6FRX26 CONTOUR (STENTS) IMPLANT
SURGILUBE 2OZ TUBE FLIPTOP (MISCELLANEOUS) ×2 IMPLANT
SYR 10ML LL (SYRINGE) ×2 IMPLANT
TRACTIP FLEXIVA PULSE ID 200 (Laser) ×1 IMPLANT
VALVE UROSEAL ADJ ENDO (VALVE) ×2 IMPLANT
WATER STERILE IRR 1000ML POUR (IV SOLUTION) ×2 IMPLANT

## 2020-09-02 NOTE — ED Notes (Signed)
See triage note  Presents with right flank pain  Sudden onset  Hx of renal stones pt is pacing on arrival to room

## 2020-09-02 NOTE — Anesthesia Postprocedure Evaluation (Signed)
Anesthesia Post Note  Patient: Sean Walters  Procedure(s) Performed: CYSTOSCOPY/URETEROSCOPY/HOLMIUM LASER/STENT PLACEMENT (Right Ureter)  Patient location during evaluation: PACU Anesthesia Type: General Level of consciousness: awake and alert Pain management: pain level controlled Vital Signs Assessment: post-procedure vital signs reviewed and stable Respiratory status: spontaneous breathing and respiratory function stable Cardiovascular status: stable Anesthetic complications: no   No complications documented.   Last Vitals:  Vitals:   09/02/20 1837 09/02/20 1845  BP:  111/69  Pulse: 94 (!) 101  Resp: 13 12  Temp:    SpO2: 99% 97%    Last Pain:  Vitals:   09/02/20 1830  TempSrc:   PainSc: Asleep                 [name]

## 2020-09-02 NOTE — Transfer of Care (Signed)
Immediate Anesthesia Transfer of Care Note  Patient: Sean Walters  Procedure(s) Performed: CYSTOSCOPY/URETEROSCOPY/HOLMIUM LASER/STENT PLACEMENT (Right Ureter)  Patient Location: PACU  Anesthesia Type:General  Level of Consciousness: awake and patient cooperative  Airway & Oxygen Therapy: Patient Spontanous Breathing and Patient connected to face mask oxygen  Post-op Assessment: Report given to RN and Post -op Vital signs reviewed and stable  Post vital signs: Reviewed and stable  Last Vitals:  Vitals Value Taken Time  BP 102/65 09/02/20 1830  Temp 36.8 C 09/02/20 1830  Pulse 87 09/02/20 1837  Resp 13 09/02/20 1837  SpO2 100 % 09/02/20 1837  Vitals shown include unvalidated device data.  Last Pain:  Vitals:   09/02/20 1830  TempSrc:   PainSc: Asleep         Complications: No complications documented.

## 2020-09-02 NOTE — ED Notes (Signed)
Family at bedside. 

## 2020-09-02 NOTE — ED Notes (Signed)
Report called to [facility]  Pt transported via stretcher    Father at bedside

## 2020-09-02 NOTE — ED Triage Notes (Signed)
Pt comes with c/o right sided flank pain. Pt states it started couple days ago. Pt states burning and pain with urination.  Pt states some nausea.

## 2020-09-02 NOTE — Discharge Instructions (Signed)
AMBULATORY SURGERY  DISCHARGE INSTRUCTIONS   1) The drugs that you were given will stay in your system until tomorrow so for the next 24 hours you should not:  A) Drive an automobile B) Make any legal decisions C) Drink any alcoholic beverage   2) You Jaye resume regular meals tomorrow.  Today it is better to start with liquids and gradually work up to solid foods.  You Durley eat anything you prefer, but it is better to start with liquids, then soup and crackers, and gradually work up to solid foods.   3) Please notify your doctor immediately if you have any unusual bleeding, trouble breathing, redness and pain at the surgery site, drainage, fever, or pain not relieved by medication.  4) Your post-operative visit with Dr.                                     is: Date:                        Time:    Please call to schedule your post-operative visit.  5) Additional Instructions:  

## 2020-09-02 NOTE — ED Provider Notes (Signed)
[facility] Emergency Department Provider Note  ____________________________________________   Event Date/Time   First MD Initiated Contact with Patient 09/02/20 0831     (approximate)  I have reviewed the triage vital signs and the nursing notes.   HISTORY  Chief Complaint Flank Pain   HPI Sean Walters is a 35 y.o. male with past medical history of prostatitis, IBS, psoriatic arthritis and sacroiliitis as well as recently diagnosed kidney stone yesterday after being seen in the ED and undergoing a CT scan presents for recurrence of some right-sided lower back and flank pain associate with gross hematuria little bit of burning with urination and nausea.  It seems patient initially developed some right lower back pain and flank pain therefore yesterday and his pain had gotten better after receiving pain medicine in the emergency room but returned again early this morning.  He denies any vomiting, diarrhea, new back or abdominal pain, chest pain, cough, shortness of breath, fevers, rash or extremity pain.  He states he was taking Flomax and Pyridium but has not taken any pain medicine since leaving yesterday.  No other acute concerns at this time.          Past Medical History:  Diagnosis Date  . Chronic prostatitis   . Irritable bowel syndrome   . Other psoriasis   . Psoriatic arthritis (HCC)    Dr Gavin Potters  . Sacroiliitis [facility])     Patient Active Problem List   Diagnosis Date Noted  . Calculus of ureterovesical junction (UVJ) 09/02/2020  . Gross hematuria 08/26/2020  . Acute prostatitis 06/05/2019  . Strain of thoracic region 02/03/2019  . Dysphagia 02/03/2019  . Urinary urgency 01/30/2015  . Poison ivy dermatitis 10/16/2013  . Chronic prostatitis 04/05/2012  . Vasovagal syncope 02/22/2012  . IBS 11/26/2006  . TENSION HEADACHE 06/24/2006  . MIGRAINE, UNSPEC., W/O INTRACTABLE MIGRAINE 06/24/2006  . HYPERTENSION, BENIGN SYSTEMIC 06/24/2006  .  SEBORRHEIC DERMATITIS, NOS 06/24/2006  . PSORIASIS 06/24/2006  . TACHYCARDIA 06/24/2006    Past Surgical History:  Procedure Laterality Date  . CYSTOSCOPY  [id number]   normal  . ESOPHAGOGASTRODUODENOSCOPY  [id number]  . US ECHOCARDIOGRAPHY  07/31/02    Prior to Admission medications   Medication Sig Start Date End Date Taking? Authorizing Provider  ibuprofen (ADVIL) 800 MG tablet TAKE 1 TABLET (800 MG TOTAL) BY MOUTH 3 (THREE) TIMES DAILY. FOR 1-2 WEEKS WITH MEALS. THEN USE IT AS NEEDED 07/03/19   Karie Schwalbe, MD  ondansetron (ZOFRAN ODT) 4 MG disintegrating tablet Take 1 tablet (4 mg total) by mouth every 8 (eight) hours as needed. 08/24/19   Darci Current, MD  phenazopyridine (PYRIDIUM) 100 MG tablet Take 1 tablet (100 mg total) by mouth 3 (three) times daily as needed for up to 2 days for pain. 09/01/20 09/03/20  Delton Prairie, MD  tamsulosin (FLOMAX) 0.4 MG CAPS capsule Take 1 capsule (0.4 mg total) by mouth daily. 09/01/20   Delton Prairie, MD    Allergies Caffeine  Family History  Problem Relation Age of Onset  . Coronary artery disease Neg Hx   . Hypertension Neg Hx   . Diabetes Neg Hx   . Kidney cancer Neg Hx   . Prostate cancer Neg Hx     Social History Social History   Tobacco Use  . Smoking status: Current Every Day Smoker    Packs/day: 1.00    Years: 2.00    Pack years: 2.00    Types: Cigarettes  .  Smokeless tobacco: Never Used  . Tobacco comment: gave 1-800-quit now  Substance Use Topics  . Alcohol use: No  . Drug use: No    Review of Systems  Review of Systems  Constitutional: Negative for chills and fever.  HENT: Negative for sore throat.   Eyes: Negative for pain.  Respiratory: Negative for cough and stridor.   Cardiovascular: Negative for chest pain.  Gastrointestinal: Positive for nausea. Negative for vomiting.  Genitourinary: Positive for dysuria, flank pain ( R), hematuria and urgency.  Musculoskeletal: Positive for back pain ( R lower).   Skin: Negative for rash.  Neurological: Negative for seizures, loss of consciousness and headaches.  Psychiatric/Behavioral: Negative for suicidal ideas.  All other systems reviewed and are negative.     ____________________________________________   PHYSICAL EXAM:  VITAL SIGNS: ED Triage Vitals  Enc Vitals Group     BP 09/02/20 0812 123/85     Pulse Rate 09/02/20 0812 (!) 118     Resp 09/02/20 0812 20     Temp 09/02/20 0812 98.1 F (36.7 C)     Temp Source 09/02/20 0812 Oral     SpO2 09/02/20 0812 100 %     Weight --      Height --      Head Circumference --      Peak Flow --      Pain Score 09/02/20 0810 10     Pain Loc --      Pain Edu? --      Excl. in GC? --    Vitals:   09/02/20 1058 09/02/20 1226  BP: 119/69 120/70  Pulse: 86 80  Resp: 18 18  Temp:    SpO2: 100% 100%   Physical Exam Vitals and nursing note reviewed.  Constitutional:      General: He is in acute distress.     Appearance: He is well-developed. He is ill-appearing.  HENT:     Head: Normocephalic and atraumatic.     Right Ear: External ear normal.     Left Ear: External ear normal.     Nose: Nose normal.     Mouth/Throat:     Mouth: Mucous membranes are dry.  Eyes:     Conjunctiva/sclera: Conjunctivae normal.  Cardiovascular:     Rate and Rhythm: Regular rhythm. Tachycardia present.     Heart sounds: No murmur heard.   Pulmonary:     Effort: Pulmonary effort is normal. No respiratory distress.     Breath sounds: Normal breath sounds.  Abdominal:     Palpations: Abdomen is soft.     Tenderness: There is no abdominal tenderness. There is right CVA tenderness. There is no left CVA tenderness.  Musculoskeletal:     Cervical back: Neck supple.  Skin:    General: Skin is warm and dry.  Neurological:     Mental Status: He is alert and oriented to person, place, and time.  Psychiatric:        Mood and Affect: Mood normal.      ____________________________________________    LABS (all labs ordered are listed, but only abnormal results are displayed)  Labs Reviewed  URINALYSIS, COMPLETE (UACMP) WITH MICROSCOPIC - Abnormal; Notable for the following components:      Result Value   Color, Urine ORANGE (*)    APPearance CLEAR (*)    Glucose, UA   (*)    Value: TEST NOT REPORTED DUE TO COLOR INTERFERENCE OF URINE PIGMENT   Hgb urine dipstick   (*)  Value: TEST NOT REPORTED DUE TO COLOR INTERFERENCE OF URINE PIGMENT   Bilirubin Urine   (*)    Value: TEST NOT REPORTED DUE TO COLOR INTERFERENCE OF URINE PIGMENT   Ketones, ur   (*)    Value: TEST NOT REPORTED DUE TO COLOR INTERFERENCE OF URINE PIGMENT   Protein, ur   (*)    Value: TEST NOT REPORTED DUE TO COLOR INTERFERENCE OF URINE PIGMENT   Nitrite   (*)    Value: TEST NOT REPORTED DUE TO COLOR INTERFERENCE OF URINE PIGMENT   Leukocytes,Ua   (*)    Value: TEST NOT REPORTED DUE TO COLOR INTERFERENCE OF URINE PIGMENT   RBC / HPF >50 (*)    All other components within normal limits  BASIC METABOLIC PANEL - Abnormal; Notable for the following components:   Glucose, Bld 143 (*)    All other components within normal limits  RESP PANEL BY RT-PCR (FLU A&B, COVID) ARPGX2  CBC   ____________________________________________  EKG  ____________________________________________  RADIOLOGY  ED MD interpretation:    Official radiology report(s): No results found.  ____________________________________________   PROCEDURES  Procedure(s) performed (including Critical Care):  Procedures   ____________________________________________   INITIAL IMPRESSION / ASSESSMENT AND PLAN / ED COURSE        Patient presents for assessment of recurrence of right lower back pain after recently being diagnosed with a kidney stone yesterday.  He is associate with some nausea is rating around the right flank and right groin.  He is not taking any pain medication after leaving the ED yesterday.  He endorses gross  hematuria and a little bit of burning as well today which did not yesterday.  On arrival he is tachycardic with otherwise stable vital signs on room air.  He appears quite uncomfortable and little dehydrated with some mild right CVA tenderness.  Suspect likely symptomatic nephrolithiasis seen on CT yesterday which was reviewed today by myself.  This showed a 5 mm stone at the right UPJ without significant hydro and some additional nonobstructing stones in both kidneys.  Also evidence of ankylosing spondylitis.  CBC obtained today shows no leukocytosis or acute anemia.  BMP is unremarkable.  Kidney function within normal limits.  Urinalysis with many RBCs and some WBCs.  On reassessment after below noted analgesia patient still in significant pain.  Given uncontrolled pain and nausea discussed with on-call urologist Dr. Richardo Hanks will take patient to the OR for procedure today with plan for likely discharge this afternoon.    ____________________________________________   FINAL CLINICAL IMPRESSION(S) / ED DIAGNOSES  Final diagnoses:  Kidney stone    Medications  lactated ringers bolus 1,000 mL (0 mLs Intravenous Stopped 09/02/20 1018)  HYDROmorphone (DILAUDID) injection 1 mg (1 mg Intravenous Given 09/02/20 0850)  ketorolac (TORADOL) 30 MG/ML injection 30 mg (30 mg Intravenous Given 09/02/20 0848)  ondansetron (ZOFRAN) injection 4 mg (4 mg Intravenous Given 09/02/20 0848)  acetaminophen (TYLENOL) tablet 1,000 mg (1,000 mg Oral Given 09/02/20 0910)  oxyCODONE (Oxy IR/ROXICODONE) immediate release tablet 5 mg (5 mg Oral Given 09/02/20 1009)  lidocaine (XYLOCAINE) 88 mg in sodium chloride 0.9 % 100 mL IVPB (0 mg/kg  59 kg Intravenous Stopped 09/02/20 1103)  lactated ringers bolus 1,000 mL (0 mLs Intravenous Stopped 09/02/20 1135)  ondansetron (ZOFRAN) injection 4 mg (4 mg Intravenous Given 09/02/20 1017)  HYDROmorphone (DILAUDID) injection 0.5 mg (0.5 mg Intravenous Given 09/02/20 1103)  lactated ringers bolus 500  mL (500 mLs Intravenous New Bag/Given 09/02/20  1135)     ED Discharge Orders    None       Note:  This document was prepared using Dragon voice recognition software and Kelliher include unintentional dictation errors.   Gilles Chiquito, MD 09/02/20 1228

## 2020-09-02 NOTE — H&P (Signed)
09/02/20 12:42 PM   Abrahim Sargent Armentrout 28-May-1985 [id number]  CC: 98mm right distal ureteral stone  HPI: I was asked to see Mr. Mayon by Dr. Katrinka Blazing for uncontrolled right sided flank pain secondary to a 5 mm right distal ureteral stone.  He is a 35 year old male with multiple prior stone episodes, none of which required surgery.  He reports a week of gross hematuria and some urinary urgency and frequency, with worsening pain over the last few days as well as nausea and vomiting.  He was seen in the ED originally yesterday where CT showed a 5 mm distal ureteral stone with minimal hydronephrosis, and urinalysis was noninfected.  He returned today with uncontrolled renal colic.  He denies any fevers or chills.  Urinalysis with microscopic hematuria but otherwise benign yesterday, and 6-10 WBCs today but no bacteria.  There are no aggravating or alleviating factors.  Severity is severe.   PMH: Past Medical History:  Diagnosis Date  . Chronic prostatitis   . Irritable bowel syndrome   . Other psoriasis   . Psoriatic arthritis (HCC)    Dr Gavin Potters  . Sacroiliitis [facility])     Surgical History: Past Surgical History:  Procedure Laterality Date  . CYSTOSCOPY  [id number]   normal  . ESOPHAGOGASTRODUODENOSCOPY  [id number]  . US ECHOCARDIOGRAPHY  07/31/02     Family History: Family History  Problem Relation Age of Onset  . Coronary artery disease Neg Hx   . Hypertension Neg Hx   . Diabetes Neg Hx   . Kidney cancer Neg Hx   . Prostate cancer Neg Hx     Social History:  reports that he has been smoking cigarettes. He has a 2.00 pack-year smoking history. He has never used smokeless tobacco. He reports that he does not drink alcohol and does not use drugs.  Physical Exam: BP 120/70 (BP Location: Right Arm)   Pulse 80   Temp 98.1 F (36.7 C) (Oral)   Resp 18   Ht 5\' 11"  (1.803 m)   Wt 59 kg   SpO2 100%   BMI 18.14 kg/m    Constitutional: Shaking, and significant pain, dry  heaving Cardiovascular: Regular rate and rhythm Respiratory: Clear to auscultation bilaterally GI: Abdomen is soft, nontender, nondistended, no abdominal masses GU: Right CVA tenderness Lymph: No cervical or inguinal lymphadenopathy. Skin: No rashes, bruises or suspicious lesions. Neurologic: Grossly intact, no focal deficits, moving all 4 extremities.  Laboratory Data: Reviewed, see HPI  Pertinent Imaging: I have personally viewed and interpreted the CT showing a 5 mm right distal ureteral stone with minimal upstream hydronephrosis.  Assessment & Plan:   35 year old male with history of stones who presents with 1 week of gross hematuria and right-sided flank pain and CT showing a 5 mm right distal ureteral stone.  He has had multiple ED visits, and appears extremely uncomfortable despite numerous IV pain medications in the ED.  We discussed various treatment options for urolithiasis including observation with or without medical expulsive therapy, shockwave lithotripsy (SWL), ureteroscopy and laser lithotripsy with stent placement, and percutaneous nephrolithotomy.  We discussed that management is based on stone size, location, density, patient co-morbidities, and patient preference.   Stones <5mm in size have a >80% spontaneous passage rate. Data surrounding the use of tamsulosin for medical expulsive therapy is controversial, but meta analyses suggests it is most efficacious for distal stones between 5-79mm in size. Possible side effects include dizziness/lightheadedness, and retrograde ejaculation.  SWL has a lower stone free  rate in a single procedure, but also a lower complication rate compared to ureteroscopy and avoids a stent and associated stent related symptoms. Possible complications include renal hematoma, steinstrasse, and need for additional treatment.  Ureteroscopy with laser lithotripsy and stent placement has a higher stone free rate than SWL in a single procedure, however  increased complication rate including possible infection, ureteral injury, bleeding, and stent related morbidity. Common stent related symptoms include dysuria, urgency/frequency, and flank pain.  After an extensive discussion of the risks and benefits of the above treatment options, the patient would like to proceed with right ureteroscopy, laser lithotripsy, stent placement today.  Informed consent obtained.  We will plan to discharge after surgery this afternoon.   Legrand Rams, MD 09/02/2020  [facility] [address] [place], [place] [address] [phone]

## 2020-09-02 NOTE — ED Notes (Signed)
Admitting MD in with pt    

## 2020-09-02 NOTE — Anesthesia Procedure Notes (Signed)
Procedure Name: Intubation Date/Time: 09/02/2020 5:33 PM Performed by: Debe Coder, CRNA Pre-anesthesia Checklist: Patient identified, Emergency Drugs available, Suction available and Patient being monitored Patient Re-evaluated:Patient Re-evaluated prior to induction Oxygen Delivery Method: Circle system utilized Preoxygenation: Pre-oxygenation with 100% oxygen Induction Type: IV induction Ventilation: Mask ventilation without difficulty Laryngoscope Size: Mac and 4 Grade View: Grade I Tube type: Oral Tube size: 7.5 mm Number of attempts: 1 Airway Equipment and Method: Stylet Placement Confirmation: ETT inserted through vocal cords under direct vision,  positive ETCO2 and breath sounds checked- equal and bilateral Secured at: 21 cm Tube secured with: Tape Dental Injury: Teeth and Oropharynx as per pre-operative assessment

## 2020-09-02 NOTE — Anesthesia Preprocedure Evaluation (Signed)
Anesthesia Evaluation  Patient identified by MRN, date of birth, ID band Patient awake    Reviewed: Allergy & Precautions, NPO status , Patient's Chart, lab work & pertinent test results  History of Anesthesia Complications Negative for: history of anesthetic complications  Airway Mallampati: II       Dental   Pulmonary neg sleep apnea, neg COPD, Current Smoker,           Cardiovascular (-) hypertension(-) Past MI and (-) CHF (-) dysrhythmias (-) Valvular Problems/Murmurs     Neuro/Psych neg Seizures    GI/Hepatic Neg liver ROS, neg GERD  ,  Endo/Other  neg diabetes  Renal/GU negative Renal ROS     Musculoskeletal   Abdominal   Peds  Hematology   Anesthesia Other Findings   Reproductive/Obstetrics                             Anesthesia Physical Anesthesia Plan  ASA: II and emergent  Anesthesia Plan: General   Post-op Pain Management:    Induction: Intravenous  PONV Risk Score and Plan: 1  Airway Management Planned: Oral ETT  Additional Equipment:   Intra-op Plan:   Post-operative Plan:   Informed Consent: I have reviewed the patients History and Physical, chart, labs and discussed the procedure including the risks, benefits and alternatives for the proposed anesthesia with the patient or authorized representative who has indicated his/her understanding and acceptance.       Plan Discussed with:   Anesthesia Plan Comments:         Anesthesia Quick Evaluation

## 2020-09-02 NOTE — ED Notes (Signed)
conts to pace at bedside  States pain is not getting any better  Provider aware

## 2020-09-02 NOTE — Op Note (Signed)
Date of procedure: 09/02/20  Preoperative diagnosis:  1. Right distal ureteral stone 2. Right renal stone  Postoperative diagnosis:  1. Right distal ureteral stone 2. Right renal stone  Procedure: 1. Cystoscopy, right retrograde pyelogram with intraoperative interpretation, right ureteral stent placement 2. Right ureteroscopy, laser lithotripsy of distal ureteral stone 3. Right ureteroscopy, laser lithotripsy of right renal stone  Surgeon: Legrand Rams, MD  Anesthesia: General  Complications: None  Intraoperative findings:  1.  Normal cystoscopy 2.  Distal stone fragmented and irrigated free from ureter 3.  Right midpole renal stone dusted 4.  Uncomplicated stent placement  EBL: Minimal  Specimens: None  Drains: Right 6 French by 26 cm ureteral stent with Dangler  Indication: Sean Walters is a 35 y.o. patient with 5 mm right distal ureteral stone and poorly controlled renal colic.  After reviewing the management options for treatment, they elected to proceed with the above surgical procedure(s). We have discussed the potential benefits and risks of the procedure, side effects of the proposed treatment, the likelihood of the patient achieving the goals of the procedure, and any potential problems that might occur during the procedure or recuperation. Informed consent has been obtained.  Description of procedure:  The patient was taken to the operating room and general anesthesia was induced. SCDs were placed for DVT prophylaxis. The patient was placed in the dorsal lithotomy position, prepped and draped in the usual sterile fashion, and preoperative antibiotics(Ancef) were administered. A preoperative time-out was performed.   A 21 French rigid cystoscope was used to intubate the urethra and a normal-appearing urethra was followed proximally in the bladder.  The prostate was small.  Thorough cystoscopy was performed and was grossly normal.  A sensor wire was used to intubate  the right ureteral orifice and this advanced easily up to the kidney under fluoroscopic vision.  With the aid of a second sensor wire and train track fashion, the semirigid ureteroscope was able to be advanced into the distal right ureter.  I immediately identified a hard yellow stone and this was fragmented to dust with a 242 m laser fiber on settings of 1.0 J and 10 Hz.  The pieces were all irrigated free from the ureter.  A second sensor wire was advanced through the semirigid ureteroscope and the semirigid ureteroscope removed.  A single channel digital flexible ureteroscope advanced easily over the wire up to the kidney, and identified a 4 mm midpole stone.  The laser on previously mentioned settings was used to fragment the stone to dust.  Thorough pyeloscopy revealed no other fragments.  Retrograde pyelogram was performed from the proximal ureter and showed no extravasation, hydronephrosis, or filling defects.  Careful pullback ureteroscopy showed no ureteral abnormalities or residual fragments.  The rigid cystoscope was backloaded over the wire and a 6 [place] by 26 cm ureteral stent with Dangler attached was uneventfully placed with a shepherd's hook in the upper pole, and a curl in the bladder under direct vision.  The bladder was drained.  Lidojet was injected into the urethra, and a belladonna suppository was placed.  Disposition: Stable to PACU  Plan: Discharge home this evening Remove stent at home Friday morning Consider 24-hour urine in follow-up with history of recurrent stones  Legrand Rams, MD

## 2020-09-03 ENCOUNTER — Encounter: Payer: Self-pay | Admitting: Urology

## 2020-09-04 ENCOUNTER — Telehealth: Payer: Self-pay | Admitting: *Deleted

## 2020-09-04 ENCOUNTER — Telehealth: Payer: Self-pay

## 2020-09-04 ENCOUNTER — Encounter: Payer: Self-pay | Admitting: *Deleted

## 2020-09-04 NOTE — Telephone Encounter (Signed)
.  left message to have patient return my call.  

## 2020-09-04 NOTE — Telephone Encounter (Signed)
Left message on VM to see how pt is doing after recent ER and Hospital staff for kidney stones.

## 2020-09-04 NOTE — Telephone Encounter (Signed)
-----   Message from Sondra Come, MD sent at 09/03/2020  2:06 PM EDT ----- Regarding: Follow-up I treated his kidney stone on Monday night.    Please offer him follow-up with me in about a month with a 24-hour urine test prior to see why he is forming so many kidney stones, thanks  Legrand Rams, MD 09/03/2020

## 2020-09-05 NOTE — Telephone Encounter (Signed)
.  left message to have patient return my call.  

## 2020-09-06 NOTE — Telephone Encounter (Signed)
Tried calling pt again, left message. Sent pt mychart message which he read: Last read by Araceli Bouche Burkemper at 10:23 AM on 09/05/2020.  Please advise

## 2020-09-09 NOTE — Telephone Encounter (Signed)
Spoke with patient and he will call back to schedule his follow up app once he gets the 24 hr kit   [place]

## 2022-05-05 IMAGING — CT CT RENAL STONE PROTOCOL
3 of 4 series · 7 of 46 positions shown, 13 images · non-contrast
Comparison: CT stone study 08/24/2019.
COMPARISON: CT stone study 08/24/2019.

Addendum:
CLINICAL DATA: Right-sided flank pain.

EXAM:
CT ABDOMEN AND PELVIS WITHOUT CONTRAST
TECHNIQUE: Multidetector CT imaging of the abdomen and pelvis was performed
following the standard protocol without IV contrast.

[Series 4: lung bases · axial · 0.64mm/px · z∈[-166,-126]mm · 3 of 16 slices shown, 7 images]
[im 4/16  soft-tissue]
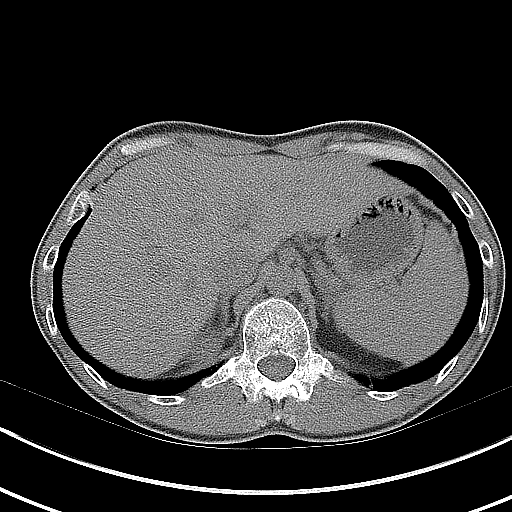
[im 4/16  lung]
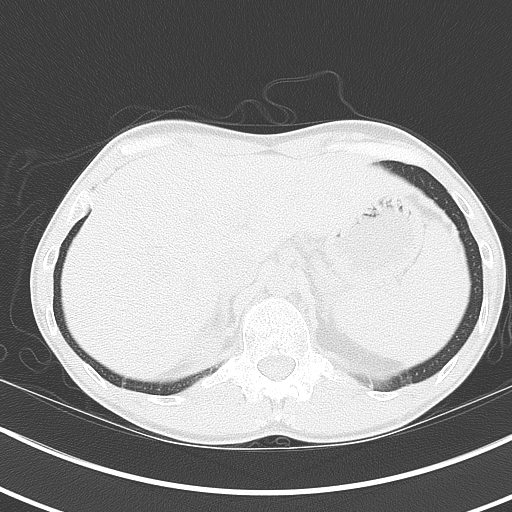
[im 4/16  bone]
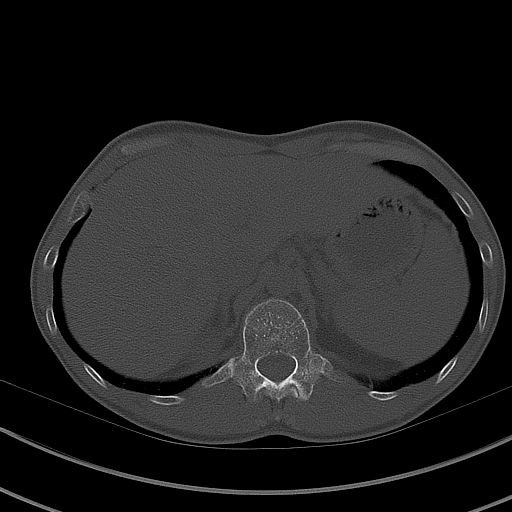
[im 8/16  soft-tissue]
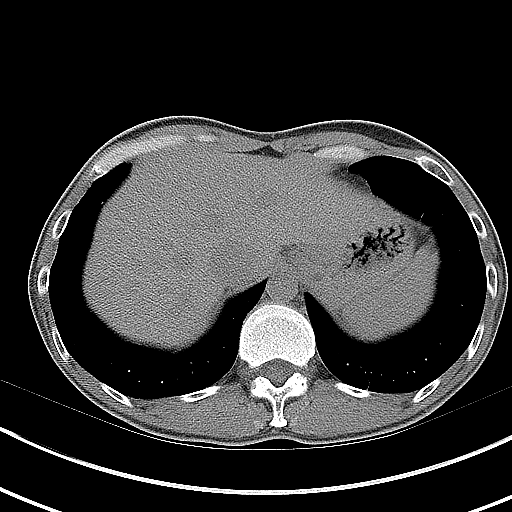
[im 8/16  lung]
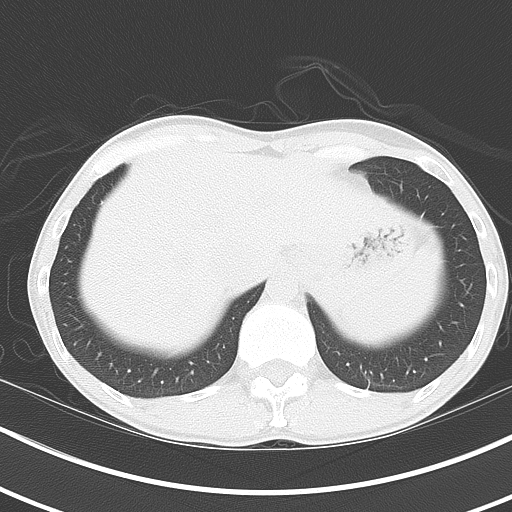
[im 12/16  soft-tissue]
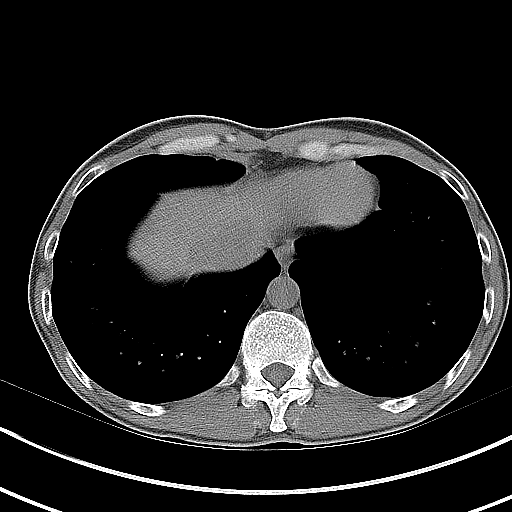
[im 12/16  lung]
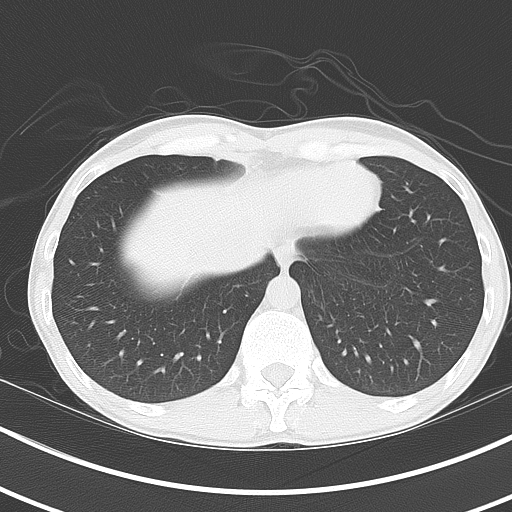

[Series 5: coronal · coronal · 0.68mm/px · 3 of 106 slices shown, 4 images]
[im 36/106  soft-tissue]
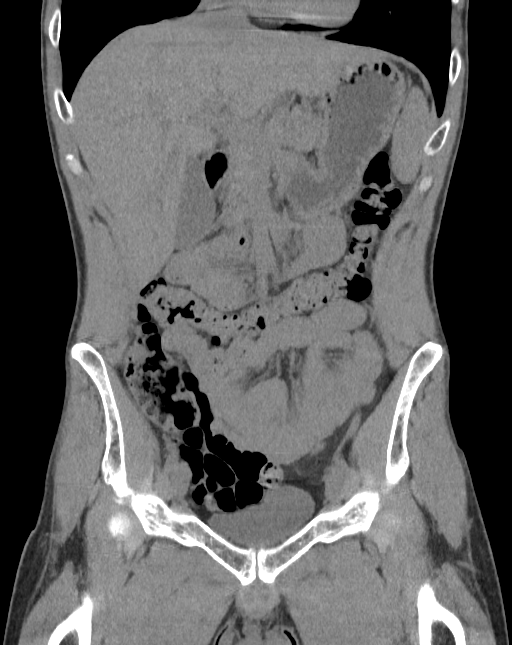
[im 47/106  soft-tissue]
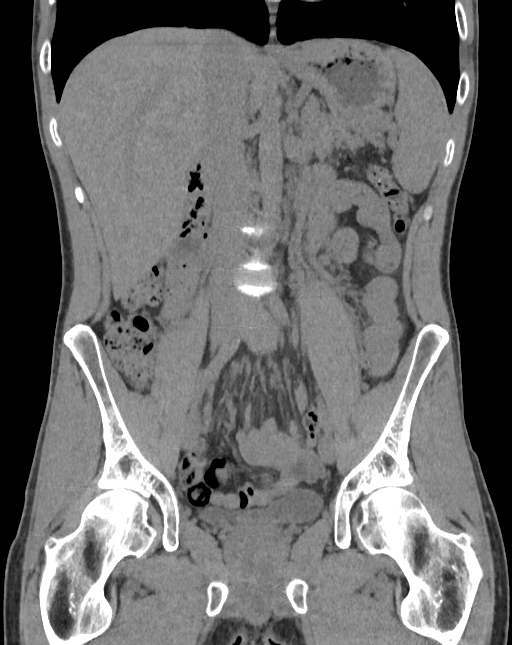
[im 47/106  bone]
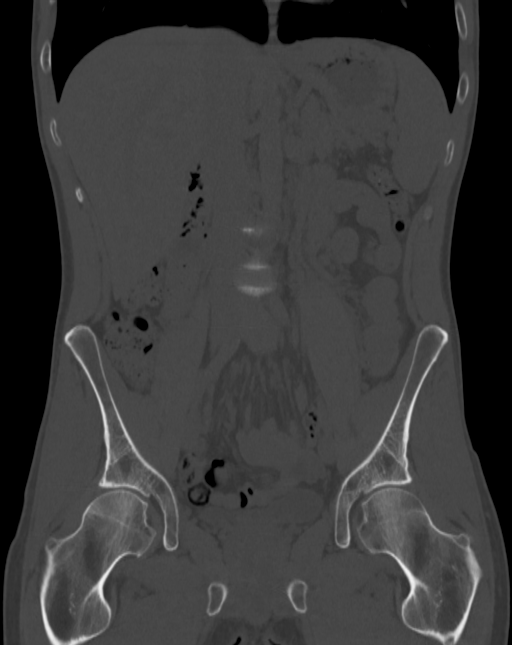
[im 59/106  soft-tissue]
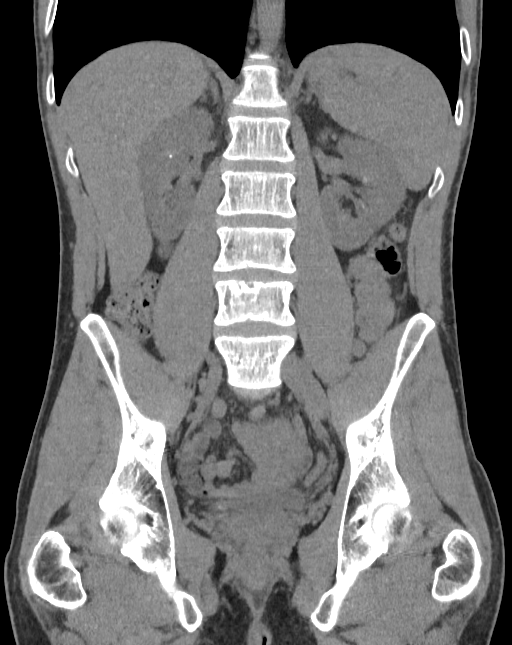

[Series 6: sagittal · sagittal · 0.44mm/px · 1 of 174 slices shown, 2 images]
[im 58/174  soft-tissue]
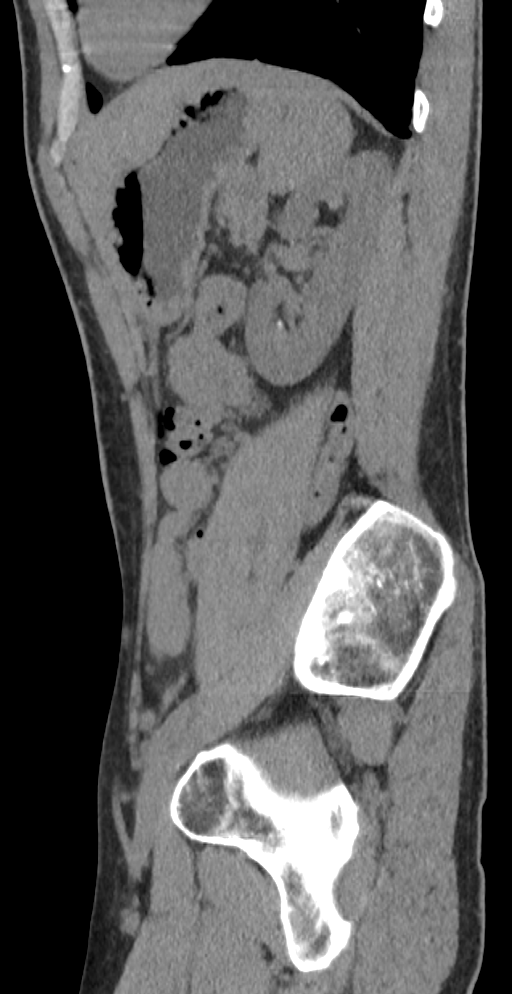
[im 58/174  bone]
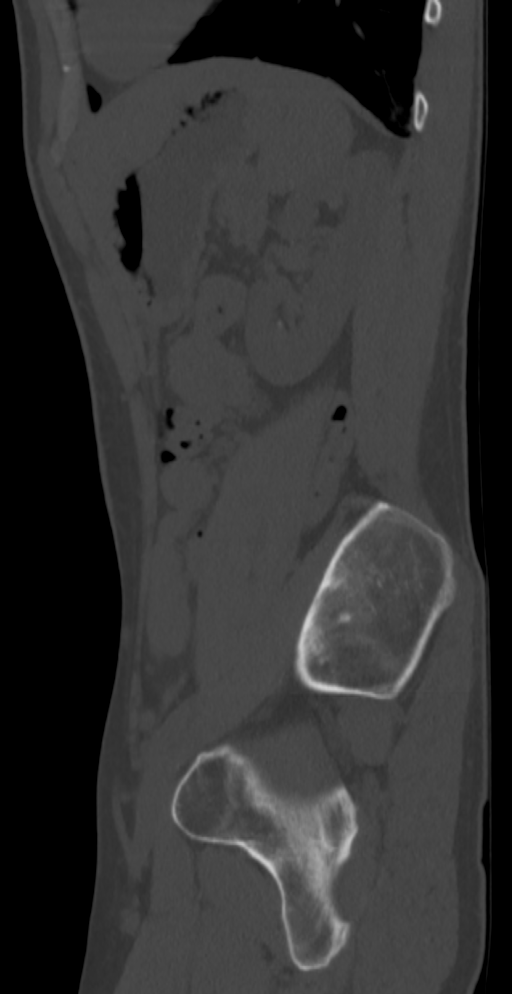

[7 of 46 positions shown; findings below may reference images not displayed]

FINDINGS: Lower chest: Minimal atelectasis is present at the lung bases. Heart
size is normal. No significant pleural or pericardial effusion is
present.

Hepatobiliary: No focal liver abnormality is seen. No gallstones,
gallbladder wall thickening, or biliary dilatation.

Pancreas: Unremarkable. No pancreatic ductal dilatation or
surrounding inflammatory changes.

Spleen: Normal in size without focal abnormality.

Adrenals/Urinary Tract: Adrenal glands are within normal limits
bilaterally. The nonobstructing stone at the upper pole of the right
kidney is stable. 4 mm stone closer to the lower pole is no longer
present. 3 mm left upper pole nonobstructing stone is stable. Slight
increased prominence of other nonobstructing left-sided stones.

A 5 mm stone is present at the right UPJ. Ureter is not dilated
above this level. No hydronephrosis is present. Left ureter is
unremarkable. The urinary bladder is within normal limits.

Stomach/Bowel: Stomach and duodenum are within normal limits. Small
bowel is unremarkable. Terminal ileum is normal. The ascending and
transverse colon are within normal limits. Descending and sigmoid
colon are normal.

Vascular/Lymphatic: No significant vascular findings are present. No
enlarged abdominal or pelvic lymph nodes.

Reproductive: Prostate is unremarkable.

Other: No abdominal wall hernia or abnormality. No abdominopelvic
ascites.

Musculoskeletal: Superior endplate Schmorl's node present at L4.
Vertebral body heights and alignment are normal otherwise. SI joints
are fused bilaterally. No ankylosis evident in the lumbar spine.
Hips are located and within normal limits.
IMPRESSION: 1. 5 mm stone at the right UPJ without significant hydronephrosis.
2. Additional nonobstructing stones in both kidneys are stable.
3. Fusion of the SI joints bilaterally. Question ankylosing
spondylitis.

ADDENDUM:
The location of the 5 mm stone should be right UVJ instead of UPJ.

*** End of Addendum ***
FINDINGS: Lower chest: Minimal atelectasis is present at the lung bases. Heart
size is normal. No significant pleural or pericardial effusion is
present.

Hepatobiliary: No focal liver abnormality is seen. No gallstones,
gallbladder wall thickening, or biliary dilatation.

Pancreas: Unremarkable. No pancreatic ductal dilatation or
surrounding inflammatory changes.

Spleen: Normal in size without focal abnormality.

Adrenals/Urinary Tract: Adrenal glands are within normal limits
bilaterally. The nonobstructing stone at the upper pole of the right
kidney is stable. 4 mm stone closer to the lower pole is no longer
present. 3 mm left upper pole nonobstructing stone is stable. Slight
increased prominence of other nonobstructing left-sided stones.

A 5 mm stone is present at the right UPJ. Ureter is not dilated
above this level. No hydronephrosis is present. Left ureter is
unremarkable. The urinary bladder is within normal limits.

Stomach/Bowel: Stomach and duodenum are within normal limits. Small
bowel is unremarkable. Terminal ileum is normal. The ascending and
transverse colon are within normal limits. Descending and sigmoid
colon are normal.

Vascular/Lymphatic: No significant vascular findings are present. No
enlarged abdominal or pelvic lymph nodes.

Reproductive: Prostate is unremarkable.

Other: No abdominal wall hernia or abnormality. No abdominopelvic
ascites.

Musculoskeletal: Superior endplate Schmorl's node present at L4.
Vertebral body heights and alignment are normal otherwise. SI joints
are fused bilaterally. No ankylosis evident in the lumbar spine.
Hips are located and within normal limits.
IMPRESSION: 1. 5 mm stone at the right UPJ without significant hydronephrosis.
2. Additional nonobstructing stones in both kidneys are stable.
3. Fusion of the SI joints bilaterally. Question ankylosing
spondylitis.

## 2022-06-06 ENCOUNTER — Emergency Department
Admission: EM | Admit: 2022-06-06 | Discharge: 2022-06-06 | Disposition: A | Discharge status: Home / Self Care | Payer: Medicaid Other | Attending: Emergency Medicine | Admitting: Emergency Medicine

## 2022-06-06 ENCOUNTER — Emergency Department: Payer: Medicaid Other

## 2022-06-06 DIAGNOSIS — N200 Calculus of kidney: Secondary | ICD-10-CM | POA: Diagnosis not present

## 2022-06-06 DIAGNOSIS — N132 Hydronephrosis with renal and ureteral calculous obstruction: Secondary | ICD-10-CM | POA: Insufficient documentation

## 2022-06-06 DIAGNOSIS — R109 Unspecified abdominal pain: Secondary | ICD-10-CM

## 2022-06-06 DIAGNOSIS — N201 Calculus of ureter: Secondary | ICD-10-CM

## 2022-06-06 DIAGNOSIS — R1032 Left lower quadrant pain: Secondary | ICD-10-CM | POA: Diagnosis present

## 2022-06-06 LAB — COMPREHENSIVE METABOLIC PANEL
ALT: 18 U/L (ref 0–44)
AST: 28 U/L (ref 15–41)
Albumin: 4.5 g/dL (ref 3.5–5.0)
Alkaline Phosphatase: 70 U/L (ref 38–126)
Anion gap: 10 (ref 5–15)
BUN: 14 mg/dL (ref 6–20)
CO2: 26 mmol/L (ref 22–32)
Calcium: 9 mg/dL (ref 8.9–10.3)
Chloride: 105 mmol/L (ref 98–111)
Creatinine, Ser: 0.89 mg/dL (ref 0.61–1.24)
GFR, Estimated: >60 mL/min (ref >60)
Glucose, Bld: 161 mg/dL — ABNORMAL HIGH (ref 70–99)
Potassium: 4 mmol/L (ref 3.5–5.1)
Sodium: 141 mmol/L (ref 135–145)
Total Bilirubin: 1.4 mg/dL — ABNORMAL HIGH (ref 0.3–1.2)
Total Protein: 7.5 g/dL (ref 6.5–8.1)

## 2022-06-06 LAB — CBC WITH DIFFERENTIAL/PLATELET
Abs Immature Granulocytes: 0.07 10*3/uL (ref 0.00–0.07)
Basophils Absolute: 0.2 10*3/uL — ABNORMAL HIGH (ref 0.0–0.1)
Basophils Relative: 1 %
Eosinophils Absolute: 0.6 10*3/uL — ABNORMAL HIGH (ref 0.0–0.5)
Eosinophils Relative: 4 %
HCT: 48.2 % (ref 39.0–52.0)
Hemoglobin: 15.7 g/dL (ref 13.0–17.0)
Immature Granulocytes: 1 %
Lymphocytes Relative: 30 %
Lymphs Abs: 4.2 10*3/uL — ABNORMAL HIGH (ref 0.7–4.0)
MCH: 29.2 pg (ref 26.0–34.0)
MCHC: 32.6 g/dL (ref 30.0–36.0)
MCV: 89.8 fL (ref 80.0–100.0)
Monocytes Absolute: 0.9 10*3/uL (ref 0.1–1.0)
Monocytes Relative: 6 %
Neutro Abs: 8.3 10*3/uL — ABNORMAL HIGH (ref 1.7–7.7)
Neutrophils Relative %: 58 %
Platelets: 305 10*3/uL (ref 150–400)
RBC: 5.37 MIL/uL (ref 4.22–5.81)
RDW: 12.9 % (ref 11.5–15.5)
WBC: 14.2 10*3/uL — ABNORMAL HIGH (ref 4.0–10.5)
nRBC: 0 % (ref 0.0–0.2)

## 2022-06-06 MED ORDER — SODIUM CHLORIDE 0.9 % IV BOLUS
1000.0000 mL | Freq: Once | INTRAVENOUS | Status: AC
  Administered 2022-06-06: 1000 mL via INTRAVENOUS

## 2022-06-06 MED ORDER — ONDANSETRON HCL 4 MG/2ML IJ SOLN
4.0000 mg | Freq: Once | INTRAMUSCULAR | Status: AC
  Administered 2022-06-06: 4 mg via INTRAVENOUS
  Filled 2022-06-06: qty 2

## 2022-06-06 MED ORDER — KETOROLAC TROMETHAMINE 10 MG PO TABS: 10.0000 mg | ORAL_TABLET | Freq: Four times a day (QID) | ORAL | 0 refills | Status: DC | PRN

## 2022-06-06 MED ORDER — HYDROCODONE-ACETAMINOPHEN 5-325 MG PO TABS: 1.0000 | ORAL_TABLET | ORAL | 0 refills | Status: DC | PRN

## 2022-06-06 MED ORDER — KETOROLAC TROMETHAMINE 30 MG/ML IJ SOLN
30.0000 mg | Freq: Once | INTRAMUSCULAR | Status: AC
  Administered 2022-06-06: 30 mg via INTRAVENOUS
  Filled 2022-06-06: qty 1

## 2022-06-06 MED ORDER — ONDANSETRON 8 MG PO TBDP: 8.0000 mg | ORAL_TABLET | Freq: Three times a day (TID) | ORAL | 0 refills | Status: DC | PRN

## 2022-06-06 NOTE — ED Notes (Signed)
Patient transported to CT 

## 2022-06-06 NOTE — ED Provider Notes (Signed)
[facility] Provider Note   Event Date/Time   First MD Initiated Contact with Patient 06/06/22 [phone]     (approximate) History  Flank Pain (Patient C/O left flank pain and nausea that began approx 0430. Patient has history of kidney stones and states this pain feels similar. )  HPI Sean Walters is a 37 y.o. male w/ a hx of kidney stones who presents for left flank pain that began earlier today and has been stable since onset.  ROS: Patient currently denies any vision changes, tinnitus, difficulty speaking, facial droop, sore throat, chest pain, shortness of breath, abdominal pain, nausea/vomiting/diarrhea, dysuria, or weakness/numbness/paresthesias in any extremity   Physical Exam  Triage Vital Signs: ED Triage Vitals  Enc Vitals Group     BP 06/06/22 0605 118/81     Pulse Rate 06/06/22 0605 (!) 104     Resp 06/06/22 0605 20     Temp 06/06/22 0608 97.6 F (36.4 C)     Temp Source 06/06/22 0608 Oral     SpO2 06/06/22 0605 99 %     Weight 06/06/22 0605 130 lb (59 kg)     Height 06/06/22 0605 5' 11"$  (1.803 m)     Head Circumference --      Peak Flow --      Pain Score 06/06/22 0605 10     Pain Loc --      Pain Edu? --      Excl. in [place]? --    Most recent vital signs: Vitals:   06/06/22 0700 06/06/22 0715  BP: 111/88   Pulse:  (!) 103  Resp:  18  Temp:    SpO2: 97% 95%   General: Awake, oriented x4. CV:  Good peripheral perfusion.  Resp:  Normal effort.  Abd:  No distention.  Other:  Middle-aged Caucasian male laying in bed in no acute distress ED Results / Procedures / Treatments  Labs (all labs ordered are listed, but only abnormal results are displayed) Labs Reviewed  COMPREHENSIVE METABOLIC PANEL - Abnormal; Notable for the following components:      Result Value   Glucose, Bld 161 (*)    Total Bilirubin 1.4 (*)    All other components within normal limits  CBC WITH DIFFERENTIAL/PLATELET - Abnormal; Notable for the following components:    WBC 14.2 (*)    Neutro Abs 8.3 (*)    Lymphs Abs 4.2 (*)    Eosinophils Absolute 0.6 (*)    Basophils Absolute 0.2 (*)    All other components within normal limits   RADIOLOGY ED MD interpretation: CT renal stone study interpreted independently by me shows multiple nonobstructive calculi in the collecting systems of both systems as well as 4 mm calculus at or right at the UVJ on the left -Agree with radiology assessment Official radiology report(s): CT Renal Stone Study  Result Date: 06/06/2022 CLINICAL DATA:  37 year old male with history of sharp back pain this morning. EXAM: CT ABDOMEN AND PELVIS WITHOUT CONTRAST TECHNIQUE: Multidetector CT imaging of the abdomen and pelvis was performed following the standard protocol without IV contrast. RADIATION DOSE REDUCTION: This exam was performed according to the departmental dose-optimization program which includes automated exposure control, adjustment of the mA and/or kV according to patient size and/or use of iterative reconstruction technique. COMPARISON:  CT of the abdomen and pelvis 09/01/2020. FINDINGS: Lower chest: Unremarkable. Hepatobiliary: No suspicious cystic or solid hepatic lesions. No intra or extrahepatic biliary ductal dilatation. Gallbladder is unremarkable in appearance.  Pancreas: No pancreatic mass. No pancreatic ductal dilatation. No pancreatic or peripancreatic fluid collections or inflammatory changes. Spleen: Unremarkable. Adrenals/Urinary Tract: Numerous nonobstructive calculi are noted within the collecting systems of both kidneys measuring up to 6 mm in the interpolar collecting system of the right kidney. In addition, in the posterolateral aspect of the urinary bladder on the left (axial image 69 of series 2) there is a 4 mm calculus which appears at or immediately beyond the left ureterovesicular junction. Very mild left-sided hydroureteronephrosis is noted. No right hydroureteronephrosis. Urinary bladder is otherwise  unremarkable in appearance. Bilateral adrenal glands are normal in appearance. Stomach/Bowel: Unenhanced appearance of the stomach is normal. There is no pathologic dilatation of small bowel or colon. Normal appendix. Vascular/Lymphatic: Atherosclerosis in the pelvic vasculature. No lymphadenopathy confidently identified in the abdomen or pelvis on today's noncontrast CT examination. Reproductive: Prostate gland and seminal vesicles are unremarkable in appearance. Other: No significant volume of ascites noted in the visualized portions of the peritoneal cavity. Musculoskeletal: There are no aggressive appearing lytic or blastic lesions noted in the visualized portions of the skeleton. IMPRESSION: 1. In addition to multiple nonobstructive calculi in the collecting systems of both kidneys measuring up to 6 mm in the right renal collecting system, there is a 4 mm calculus at or immediately beyond the left ureterovesicular junction. This is associated with very mild proximal left hydroureteronephrosis indicating mild left-sided obstruction or recently relieved obstruction. 2. Atherosclerosis. Electronically Signed   By: Vinnie Langton M.D.   On: 06/06/2022 06:48   PROCEDURES: Critical Care performed: No .1-3 Lead EKG Interpretation  Performed by: Naaman Plummer, MD Authorized by: Naaman Plummer, MD     Interpretation: normal     ECG rate:  71   ECG rate assessment: normal     Rhythm: sinus rhythm     Ectopy: none     Conduction: normal    MEDICATIONS ORDERED IN ED: Medications  sodium chloride 0.9 % bolus 1,000 mL (0 mLs Intravenous Stopped 06/06/22 0724)  ondansetron (ZOFRAN) injection 4 mg (4 mg Intravenous Given 06/06/22 0624)  ketorolac (TORADOL) 30 MG/ML injection 30 mg (30 mg Intravenous Given 06/06/22 0624)   IMPRESSION / MDM / ASSESSMENT AND PLAN / ED COURSE  I reviewed the triage vital signs and the nursing notes.                             The patient is on the cardiac monitor to  evaluate for evidence of arrhythmia and/or significant heart rate changes. Patient's presentation is most consistent with acute presentation with potential threat to life or bodily function. Patient presents for severe flank pain. Presentation most consistent with Renal Colic from a Non-infected Kidney Stone. Given History and Exam I have lower suspicion for atypical appendicitis, genital torsion, acute cholecystitis, AAA, Aortic Dissection, Serious Bacterial Illness or other emergent intraabdominal pathology.  Workup: CBC, BMP, CT Abd/Pelvis noncontrast, UA, reassess Findings: L UVJ stone Reassesment: Patient tolerating PO and pain controlled Disposition:  Discharge. Strict return precautions for infected stone or PO intolerance discussed.   FINAL CLINICAL IMPRESSION(S) / ED DIAGNOSES   Final diagnoses:  Left flank pain  Ureterolithiasis   Rx / DC Orders   ED Discharge Orders          Ordered    HYDROcodone-acetaminophen (NORCO) 5-325 MG tablet  Every 4 hours PRN        06/06/22 0703    ondansetron (  ZOFRAN-ODT) 8 MG disintegrating tablet  Every 8 hours PRN        06/06/22 0703    ketorolac (TORADOL) 10 MG tablet  Every 6 hours PRN       Note to Pharmacy: Patient given an IM/IV loading dose in emergency department   06/06/22 0703           Note:  This document was prepared using Dragon voice recognition software and [place] include unintentional dictation errors.   Naaman Plummer, MD 06/06/22 [phone]

## 2022-06-06 NOTE — ED Notes (Signed)
Patient restless and appears uncomfortable

## 2022-06-08 ENCOUNTER — Telehealth: Payer: Self-pay

## 2022-06-08 NOTE — Transitions of Care (Post Inpatient/ED Visit) (Unsigned)
   06/08/2022  Name: Sean Walters MRN: [id number] DOB: 1986/02/20  Today's TOC FU Call Status: Today's TOC FU Call Status:: Unsuccessul Call (1st Attempt) Unsuccessful Call (1st Attempt) Date: 06/08/22  Attempted to reach the patient regarding the most recent Inpatient/ED visit.  Follow Up Plan: Additional outreach attempts will be made to reach the patient to complete the Transitions of Care (Post Inpatient/ED visit) call.   [place] LPN [place] Advisor Direct Dial [phone]

## 2022-06-09 NOTE — Transitions of Care (Post Inpatient/ED Visit) (Signed)
   06/09/2022  Name: Sean Walters MRN: [id number] DOB: Jan 21, 1986  Today's TOC FU Call Status: Today's TOC FU Call Status:: Unsuccessful Call (2nd Attempt) Unsuccessful Call (1st Attempt) Date: 06/08/22 Unsuccessful Call (2nd Attempt) Date: 06/09/22  Attempted to reach the patient regarding the most recent Inpatient/ED visit.  Follow Up Plan: No further outreach attempts will be made at this time. We have been unable to contact the patient.  [place] LPN [place] Advisor Direct Dial [phone]

## 2022-09-08 ENCOUNTER — Encounter: Payer: Self-pay | Admitting: Internal Medicine

## 2022-09-08 ENCOUNTER — Ambulatory Visit (INDEPENDENT_AMBULATORY_CARE_PROVIDER_SITE_OTHER): Payer: Medicaid Other | Admitting: Internal Medicine

## 2022-09-08 VITALS — BP 100/60 | HR 90 | Temp 98.2°F | Ht 71.0 in | Wt 125.2 lb

## 2022-09-08 DIAGNOSIS — H6122 Impacted cerumen, left ear: Secondary | ICD-10-CM | POA: Diagnosis not present

## 2022-09-08 NOTE — Assessment & Plan Note (Signed)
No evidience of tick or foreign body Cerumen removed Discussed dilute hydrogen peroxide if needed

## 2022-09-08 NOTE — Progress Notes (Signed)
   Subjective:    Patient ID: Sean Walters, male    DOB: 14-Oct-1985, 37 y.o.   MRN: [id number]  HPI Here due to a tick in his left ear  Noticed crawling in his left ear--4 days ago Dug at it---then it seemed to stop moving Thinks it is latched on No sig pain--just irritating  No fever No headache No rash  Current Outpatient Medications on File Prior to Visit  Medication Sig Dispense Refill   ibuprofen (ADVIL) 800 MG tablet TAKE 1 TABLET (800 MG TOTAL) BY MOUTH 3 (THREE) TIMES DAILY. FOR 1-2 WEEKS WITH MEALS. THEN USE IT AS NEEDED 100 tablet 0   No current facility-administered medications on file prior to visit.    Allergies  Allergen Reactions   Caffeine Other (See Comments)    Bladder irritation    Past Medical History:  Diagnosis Date   Chronic prostatitis    Irritable bowel syndrome    Other psoriasis    Psoriatic arthritis (HCC)    Dr Gavin Potters   Sacroiliitis [facility])     Past Surgical History:  Procedure Laterality Date   CYSTOSCOPY  [id number]   normal   CYSTOSCOPY/URETEROSCOPY/HOLMIUM LASER/STENT PLACEMENT Right 09/02/2020   Procedure: CYSTOSCOPY/URETEROSCOPY/HOLMIUM LASER/STENT PLACEMENT;  Surgeon: Sondra Come, MD;  Location: [facility] ORS;  Service: Urology;  Laterality: Right;   ESOPHAGOGASTRODUODENOSCOPY  [id number]   US ECHOCARDIOGRAPHY  07/31/02    Family History  Problem Relation Age of Onset   Coronary artery disease Neg Hx    Hypertension Neg Hx    Diabetes Neg Hx    Kidney cancer Neg Hx    Prostate cancer Neg Hx     Social History   Socioeconomic History   Marital status: Single    Spouse name: Not on file   Number of children: Not on file   Years of education: Not on file   Highest education level: Not on file  Occupational History   Occupation: Out of work--disabled due to arthritis  Tobacco Use   Smoking status: Every Day    Packs/day: 1.00    Years: 2.00    Additional pack years: 0.00    Total pack years: 2.00    Types: Cigarettes    Smokeless tobacco: Never   Tobacco comments:    gave 1-800-quit now  Substance and Sexual Activity   Alcohol use: No   Drug use: No   Sexual activity: Not on file  Other Topics Concern   Not on file  Social History Narrative   Not on file   Social Determinants of Health   Financial Resource Strain: Not on file  Food Insecurity: Not on file  Transportation Needs: Not on file  Physical Activity: Not on file  Stress: Not on file  Social Connections: Not on file  Intimate Partner Violence: Not on file   Review of Systems Feels fine     Objective:   Physical Exam Constitutional:      Appearance: Normal appearance.  HENT:     Ears:     Comments: Cerumen at the opening of left canal---removed with curette with resolution of the sensation he had Still some hard cerumen just at bottom of canal--couldn't remove No apparent tick or foreign body Neurological:     Mental Status: He is alert.            Assessment & Plan:

## 2022-10-15 ENCOUNTER — Emergency Department: Payer: Medicaid Other

## 2022-10-15 ENCOUNTER — Encounter: Payer: Self-pay | Admitting: Emergency Medicine

## 2022-10-15 ENCOUNTER — Emergency Department
Admission: EM | Admit: 2022-10-15 | Discharge: 2022-10-15 | Disposition: A | Discharge status: Home / Self Care | Payer: Medicaid Other | Attending: Emergency Medicine | Admitting: Emergency Medicine

## 2022-10-15 ENCOUNTER — Other Ambulatory Visit: Payer: Self-pay

## 2022-10-15 DIAGNOSIS — N132 Hydronephrosis with renal and ureteral calculous obstruction: Secondary | ICD-10-CM | POA: Insufficient documentation

## 2022-10-15 DIAGNOSIS — N200 Calculus of kidney: Secondary | ICD-10-CM | POA: Diagnosis not present

## 2022-10-15 DIAGNOSIS — I10 Essential (primary) hypertension: Secondary | ICD-10-CM | POA: Insufficient documentation

## 2022-10-15 DIAGNOSIS — R1031 Right lower quadrant pain: Secondary | ICD-10-CM | POA: Diagnosis present

## 2022-10-15 LAB — BASIC METABOLIC PANEL
Anion gap: 9 (ref 5–15)
BUN: 13 mg/dL (ref 6–20)
CO2: 25 mmol/L (ref 22–32)
Calcium: 8.9 mg/dL (ref 8.9–10.3)
Chloride: 105 mmol/L (ref 98–111)
Creatinine, Ser: 0.97 mg/dL (ref 0.61–1.24)
GFR, Estimated: >60 mL/min (ref >60)
Glucose, Bld: 124 mg/dL — ABNORMAL HIGH (ref 70–99)
Potassium: 4.2 mmol/L (ref 3.5–5.1)
Sodium: 139 mmol/L (ref 135–145)

## 2022-10-15 LAB — CBC
HCT: 46.3 % (ref 39.0–52.0)
Hemoglobin: 15.3 g/dL (ref 13.0–17.0)
MCH: 30.1 pg (ref 26.0–34.0)
MCHC: 33 g/dL (ref 30.0–36.0)
MCV: 91 fL (ref 80.0–100.0)
Platelets: 261 10*3/uL (ref 150–400)
RBC: 5.09 MIL/uL (ref 4.22–5.81)
RDW: 12.9 % (ref 11.5–15.5)
WBC: 10.6 10*3/uL — ABNORMAL HIGH (ref 4.0–10.5)
nRBC: 0 % (ref 0.0–0.2)

## 2022-10-15 LAB — URINALYSIS, ROUTINE W REFLEX MICROSCOPIC
Bacteria, UA: NONE SEEN
Bilirubin Urine: NEGATIVE
Glucose, UA: NEGATIVE mg/dL
Ketones, ur: 5 mg/dL — AB
Leukocytes,Ua: NEGATIVE
Nitrite: NEGATIVE
Protein, ur: 100 mg/dL — AB
RBC / HPF: >50 RBC/hpf (ref 0–5)
Specific Gravity, Urine: 1.021 (ref 1.005–1.030)
pH: 6 (ref 5.0–8.0)

## 2022-10-15 MED ORDER — LACTATED RINGERS IV BOLUS
1000.0000 mL | Freq: Once | INTRAVENOUS | Status: AC
  Administered 2022-10-15: 1000 mL via INTRAVENOUS

## 2022-10-15 MED ORDER — HYDROMORPHONE HCL 1 MG/ML IJ SOLN
0.5000 mg | Freq: Once | INTRAMUSCULAR | Status: AC
  Administered 2022-10-15: 0.5 mg via INTRAVENOUS
  Filled 2022-10-15: qty 0.5

## 2022-10-15 MED ORDER — OXYCODONE HCL 5 MG PO TABS: 5.0000 mg | ORAL_TABLET | Freq: Three times a day (TID) | ORAL | 0 refills | Status: DC | PRN

## 2022-10-15 MED ORDER — HYDROMORPHONE HCL 1 MG/ML IJ SOLN
1.0000 mg | Freq: Once | INTRAMUSCULAR | Status: AC
  Administered 2022-10-15: 1 mg via INTRAVENOUS
  Filled 2022-10-15: qty 1

## 2022-10-15 MED ORDER — KETOROLAC TROMETHAMINE 30 MG/ML IJ SOLN
30.0000 mg | Freq: Once | INTRAMUSCULAR | Status: AC
  Administered 2022-10-15: 30 mg via INTRAVENOUS
  Filled 2022-10-15: qty 1

## 2022-10-15 MED ORDER — FENTANYL CITRATE PF 50 MCG/ML IJ SOSY
50.0000 ug | PREFILLED_SYRINGE | INTRAMUSCULAR | Status: DC | PRN
  Administered 2022-10-15: 50 ug via INTRAVENOUS
  Filled 2022-10-15: qty 1

## 2022-10-15 NOTE — ED Notes (Signed)
See triage note  Presents with flank pain and blood in urine  States sx's started about 1 week ago   Became worse today  Unable to lay still Pacing in room

## 2022-10-15 NOTE — ED Triage Notes (Signed)
Pt presents to the ED due to blood in urine. Pt staes he noticed the blood in urine about a week ago. Pt states he has a hx of kidney stones. Pt states "I have kidney stones, one in my kidney and one in my penis trying to come out." Pt denies other urinary symptoms. Pt a&oX4

## 2022-10-15 NOTE — ED Provider Notes (Signed)
[facility] Provider Note    Event Date/Time   First MD Initiated Contact with Patient 10/15/22 [phone]     (approximate)   History   Flank Pain   HPI  Sean Walters is a 37 y.o. male  with history of hypertension, IBS, seborrheic dermatitis, prostatitis, kidney stone, and as listed in EMR presents to the emergency department for right flank and penile pain. That started about a week ago. No known fever. No vomiting. Marland Kitchen      Physical Exam   Triage Vital Signs: ED Triage Vitals  Enc Vitals Group     BP 10/15/22 0721 116/80     Pulse Rate 10/15/22 0721 (!) 119     Resp 10/15/22 0721 20     Temp 10/15/22 0721 98.2 F (36.8 C)     Temp Source 10/15/22 0721 Oral     SpO2 10/15/22 0721 99 %     Weight 10/15/22 0718 122 lb (55.3 kg)     Height --      Head Circumference --      Peak Flow --      Pain Score 10/15/22 0717 7     Pain Loc --      Pain Edu? --      Excl. in [place]? --     Most recent vital signs: Vitals:   10/15/22 0721 10/15/22 1027  BP: 116/80 110/78  Pulse: (!) 119 90  Resp: 20 20  Temp: 98.2 F (36.8 C)   SpO2: 99% 99%    General: Awake, no distress.  CV:  Good peripheral perfusion.  Resp:  Normal effort.  Abd:  No distention. Abdomen is soft. Other:  CVA tenderness on right side   ED Results / Procedures / Treatments   Labs (all labs ordered are listed, but only abnormal results are displayed) Labs Reviewed  URINALYSIS, ROUTINE W REFLEX MICROSCOPIC - Abnormal; Notable for the following components:      Result Value   Color, Urine AMBER (*)    APPearance CLOUDY (*)    Hgb urine dipstick LARGE (*)    Ketones, ur 5 (*)    Protein, ur 100 (*)    All other components within normal limits  BASIC METABOLIC PANEL - Abnormal; Notable for the following components:   Glucose, Bld 124 (*)    All other components within normal limits  CBC - Abnormal; Notable for the following components:   WBC 10.6 (*)    All other components  within normal limits     EKG     RADIOLOGY  Image and radiology report reviewed and interpreted by me. Radiology report consistent with the same.  Right hydrouteronephrosis from 5mm stone at the UVJ.  PROCEDURES:  Critical Care performed: No  Procedures   MEDICATIONS ORDERED IN ED:  Medications  HYDROmorphone (DILAUDID) injection 1 mg (1 mg Intravenous Given 10/15/22 0751)  lactated ringers bolus 1,000 mL (0 mLs Intravenous Stopped 10/15/22 1026)  ketorolac (TORADOL) 30 MG/ML injection 30 mg (30 mg Intravenous Given 10/15/22 0833)  HYDROmorphone (DILAUDID) injection 0.5 mg (0.5 mg Intravenous Given 10/15/22 1014)     IMPRESSION / MDM / ASSESSMENT AND PLAN / ED COURSE   I have reviewed the triage note.  Differential diagnosis includes, but is not limited to, infected stone, kidney stone, prostatitis  Patient's presentation is most consistent with acute complicated illness / injury requiring diagnostic workup.  37 year old male presenting to the emergency department for treatment and evaluation  of right-sided flank pain and dark urine.  See HPI for further details.  Plan will be to get labs and CT.   CT shows 5mm stone at UVJ on right side. Labs overall reassuring. Pain well controlled after fluids and medication. He will be discharged home with instruction to follow up with urology or primary care.     FINAL CLINICAL IMPRESSION(S) / ED DIAGNOSES   Final diagnoses:  Kidney stone     Rx / DC Orders   ED Discharge Orders          Ordered    oxyCODONE (ROXICODONE) 5 MG immediate release tablet  Every 8 hours PRN        10/15/22 1011             Note:  This document was prepared using Dragon voice recognition software and [place] include unintentional dictation errors.   Chinita Pester, FNP 10/15/22 1413    Sharman Cheek, MD 10/15/22 [phone]

## 2022-10-16 ENCOUNTER — Telehealth: Payer: Self-pay

## 2022-10-16 NOTE — Transitions of Care (Post Inpatient/ED Visit) (Signed)
   10/16/2022  Name: Sean Walters MRN: [id number] DOB: December 22, 1985  Today's TOC FU Call Status: Today's TOC FU Call Status:: Unsuccessul Call (1st Attempt) Unsuccessful Call (1st Attempt) Date: 10/16/22  Attempted to reach the patient regarding the most recent Inpatient/ED visit.  Follow Up Plan: Additional outreach attempts will be made to reach the patient to complete the Transitions of Care (Post Inpatient/ED visit) call.   Signature Elisha Ponder LPN [facility] AWV Team Direct dial:  [phone]

## 2022-10-19 NOTE — Transitions of Care (Post Inpatient/ED Visit) (Signed)
Unable to reach pt by phone and left v/m requesting cb [phone].      10/19/2022  Name: Sean Walters MRN: [id number] DOB: October 04, 1985  Today's TOC FU Call Status: Today's TOC FU Call Status:: Unsuccessful Call (2nd Attempt) Unsuccessful Call (1st Attempt) Date: 10/16/22 Unsuccessful Call (2nd Attempt) Date: 10/19/22  Attempted to reach the patient regarding the most recent Inpatient/ED visit.  Follow Up Plan: Additional outreach attempts will be made to reach the patient to complete the Transitions of Care (Post Inpatient/ED visit) call.   Signature  Lewanda Rife, LPN

## 2023-01-22 ENCOUNTER — Encounter: Payer: Self-pay | Admitting: Internal Medicine

## 2023-01-22 ENCOUNTER — Ambulatory Visit (INDEPENDENT_AMBULATORY_CARE_PROVIDER_SITE_OTHER): Payer: Medicaid Other | Admitting: Internal Medicine

## 2023-01-22 VITALS — BP 100/78 | HR 98 | Temp 98.5°F | Ht 71.0 in | Wt 120.0 lb

## 2023-01-22 DIAGNOSIS — L4050 Arthropathic psoriasis, unspecified: Secondary | ICD-10-CM | POA: Diagnosis not present

## 2023-01-22 MED ORDER — MELOXICAM 15 MG PO TABS: 15.0000 mg | ORAL_TABLET | Freq: Every day | ORAL | 1 refills | Status: AC

## 2023-01-22 NOTE — Assessment & Plan Note (Signed)
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

## 2023-01-22 NOTE — Progress Notes (Signed)
Subjective:    Patient ID: Sean Walters, male    DOB: 1985-10-14, 37 y.o.   MRN: [id number]  HPI Here due to joint pains  Diagnosed with arthritis at age 89 or so Followed at [facility] clinic but then lost insurance Prominent in spine and stiffness Also knees, left shoulders, some finger flares  Was on treatment in the past--IV infusion Then had reaction to it  Current Outpatient Medications on File Prior to Visit  Medication Sig Dispense Refill   ibuprofen (ADVIL) 800 MG tablet TAKE 1 TABLET (800 MG TOTAL) BY MOUTH 3 (THREE) TIMES DAILY. FOR 1-2 WEEKS WITH MEALS. THEN USE IT AS NEEDED 100 tablet 0   No current facility-administered medications on file prior to visit.    Allergies  Allergen Reactions   Caffeine Other (See Comments)    Bladder irritation    Past Medical History:  Diagnosis Date   Chronic prostatitis    Irritable bowel syndrome    Other psoriasis    Psoriatic arthritis (HCC)    Dr Gavin Potters   Sacroiliitis [facility])     Past Surgical History:  Procedure Laterality Date   CYSTOSCOPY  [id number]   normal   CYSTOSCOPY/URETEROSCOPY/HOLMIUM LASER/STENT PLACEMENT Right 09/02/2020   Procedure: CYSTOSCOPY/URETEROSCOPY/HOLMIUM LASER/STENT PLACEMENT;  Surgeon: Sondra Come, MD;  Location: [facility] ORS;  Service: Urology;  Laterality: Right;   ESOPHAGOGASTRODUODENOSCOPY  [id number]   US ECHOCARDIOGRAPHY  07/31/02    Family History  Problem Relation Age of Onset   Coronary artery disease Neg Hx    Hypertension Neg Hx    Diabetes Neg Hx    Kidney cancer Neg Hx    Prostate cancer Neg Hx     Social History   Socioeconomic History   Marital status: Single    Spouse name: Not on file   Number of children: Not on file   Years of education: Not on file   Highest education level: Not on file  Occupational History   Occupation: Out of work--disabled due to arthritis  Tobacco Use   Smoking status: Every Day    Current packs/day: 1.00    Average packs/day: 1  pack/day for 2.0 years (2.0 ttl pk-yrs)    Types: Cigarettes   Smokeless tobacco: Never   Tobacco comments:    gave 1-800-quit now  Substance and Sexual Activity   Alcohol use: No   Drug use: No   Sexual activity: Yes  Other Topics Concern   Not on file  Social History Narrative   Not on file   Social Determinants of Health   Financial Resource Strain: Not on file  Food Insecurity: Not on file  Transportation Needs: Not on file  Physical Activity: Not on file  Stress: Not on file  Social Connections: Not on file  Intimate Partner Violence: Not on file   Review of Systems No fevers Weight stable 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  Musculoskeletal:     Comments: Back has reduced but present flexion No active synovitis  Skin:    Comments: Widespread psoriatic rash  Neurological:     Mental Status: He is alert.            Assessment & Plan:

## 2023-01-29 ENCOUNTER — Encounter: Payer: Self-pay | Admitting: Internal Medicine

## 2023-02-09 ENCOUNTER — Telehealth: Payer: Self-pay

## 2023-02-09 NOTE — Telephone Encounter (Signed)
Records release form to obtain past rheumatology records from [facility] is in folders with reception for patient to sign. Please have patient sign and when signed send back in [place] [facility] box.

## 2023-03-15 ENCOUNTER — Encounter: Payer: Self-pay | Admitting: Internal Medicine

## 2023-03-16 NOTE — Telephone Encounter (Signed)
I have sent this message to the referral coordinator at [facility].   Good morning, I have tried to f/up on the ROI for [facility]. I haven't heard back from anyone.   I was looking in pt's chart and did see records from them under media for [facility] 04/14/12, 10/24/10, and 08/22/10.   Will those records work?? Pt is trying to get an appt.   Thanks,  Alcario Drought    I have also sent pt a message to see where he wants to go because [facility] states they have LM for him to call back to schedule an appt.

## 2023-05-03 DIAGNOSIS — L400 Psoriasis vulgaris: Secondary | ICD-10-CM | POA: Diagnosis not present

## 2023-05-03 DIAGNOSIS — L4050 Arthropathic psoriasis, unspecified: Secondary | ICD-10-CM | POA: Diagnosis not present

## 2023-05-03 DIAGNOSIS — Z111 Encounter for screening for respiratory tuberculosis: Secondary | ICD-10-CM | POA: Diagnosis not present

## 2023-05-03 DIAGNOSIS — Z7960 Long term (current) use of unspecified immunomodulators and immunosuppressants: Secondary | ICD-10-CM | POA: Diagnosis not present

## 2023-09-02 DIAGNOSIS — M47819 Spondylosis without myelopathy or radiculopathy, site unspecified: Secondary | ICD-10-CM | POA: Diagnosis not present

## 2023-09-02 DIAGNOSIS — L4050 Arthropathic psoriasis, unspecified: Secondary | ICD-10-CM | POA: Diagnosis not present

## 2023-09-02 DIAGNOSIS — Z7960 Long term (current) use of unspecified immunomodulators and immunosuppressants: Secondary | ICD-10-CM | POA: Diagnosis not present

## 2023-09-02 DIAGNOSIS — L400 Psoriasis vulgaris: Secondary | ICD-10-CM | POA: Diagnosis not present

## 2024-05-09 ENCOUNTER — Encounter: Payer: Self-pay | Admitting: Emergency Medicine

## 2024-05-09 ENCOUNTER — Other Ambulatory Visit: Payer: Self-pay

## 2024-05-09 DIAGNOSIS — N132 Hydronephrosis with renal and ureteral calculous obstruction: Secondary | ICD-10-CM | POA: Diagnosis not present

## 2024-05-09 DIAGNOSIS — D72829 Elevated white blood cell count, unspecified: Secondary | ICD-10-CM | POA: Diagnosis not present

## 2024-05-09 DIAGNOSIS — R109 Unspecified abdominal pain: Secondary | ICD-10-CM | POA: Diagnosis present

## 2024-05-09 MED ORDER — OXYCODONE-ACETAMINOPHEN 5-325 MG PO TABS
1.0000 | ORAL_TABLET | ORAL | Status: DC | PRN
  Administered 2024-05-09: 1 via ORAL
  Filled 2024-05-09 (×2): qty 1

## 2024-05-09 MED ORDER — ONDANSETRON 4 MG PO TBDP
4.0000 mg | ORAL_TABLET | Freq: Once | ORAL | Status: AC
  Administered 2024-05-09: 4 mg via ORAL
  Filled 2024-05-09: qty 1

## 2024-05-09 NOTE — ED Triage Notes (Signed)
 Patient ambulatory to triage with steady gait, without difficulty or distress noted; pt reports having rt flank pain radiating into abd x 1 hrs accomp by nausea; st hx of same with kidney stone; took phenergan PTA

## 2024-05-10 ENCOUNTER — Emergency Department

## 2024-05-10 ENCOUNTER — Emergency Department
Admission: EM | Admit: 2024-05-10 | Discharge: 2024-05-10 | Disposition: A | Attending: Emergency Medicine | Admitting: Emergency Medicine

## 2024-05-10 DIAGNOSIS — N200 Calculus of kidney: Secondary | ICD-10-CM

## 2024-05-10 LAB — CBC WITH DIFFERENTIAL/PLATELET
Abs Immature Granulocytes: 0.05 K/uL (ref 0.00–0.07)
Basophils Absolute: 0.2 K/uL — ABNORMAL HIGH (ref 0.0–0.1)
Basophils Relative: 1 %
Eosinophils Absolute: 0.7 K/uL — ABNORMAL HIGH (ref 0.0–0.5)
Eosinophils Relative: 5 %
HCT: 46.7 % (ref 39.0–52.0)
Hemoglobin: 15.4 g/dL (ref 13.0–17.0)
Immature Granulocytes: 0 %
Lymphocytes Relative: 34 %
Lymphs Abs: 4.6 K/uL — ABNORMAL HIGH (ref 0.7–4.0)
MCH: 30.2 pg (ref 26.0–34.0)
MCHC: 33 g/dL (ref 30.0–36.0)
MCV: 91.6 fL (ref 80.0–100.0)
Monocytes Absolute: 0.9 K/uL (ref 0.1–1.0)
Monocytes Relative: 7 %
Neutro Abs: 7.1 K/uL (ref 1.7–7.7)
Neutrophils Relative %: 53 %
Platelets: 312 K/uL (ref 150–400)
RBC: 5.1 MIL/uL (ref 4.22–5.81)
RDW: 12.3 % (ref 11.5–15.5)
WBC: 13.5 K/uL — ABNORMAL HIGH (ref 4.0–10.5)
nRBC: 0 % (ref 0.0–0.2)

## 2024-05-10 LAB — URINALYSIS, ROUTINE W REFLEX MICROSCOPIC
Bilirubin Urine: NEGATIVE
Glucose, UA: NEGATIVE mg/dL
Ketones, ur: 5 mg/dL — AB
Leukocytes,Ua: NEGATIVE
Nitrite: NEGATIVE
Protein, ur: 30 mg/dL — AB
RBC / HPF: >50 RBC/hpf (ref 0–5)
Specific Gravity, Urine: 1.019 (ref 1.005–1.030)
pH: 5 (ref 5.0–8.0)

## 2024-05-10 LAB — COMPREHENSIVE METABOLIC PANEL WITH GFR
ALT: 13 U/L (ref 0–44)
AST: 24 U/L (ref 15–41)
Albumin: 4.6 g/dL (ref 3.5–5.0)
Alkaline Phosphatase: 65 U/L (ref 38–126)
Anion gap: 12 (ref 5–15)
BUN: 18 mg/dL (ref 6–20)
CO2: 27 mmol/L (ref 22–32)
Calcium: 9.9 mg/dL (ref 8.9–10.3)
Chloride: 105 mmol/L (ref 98–111)
Creatinine, Ser: 0.92 mg/dL (ref 0.61–1.24)
GFR, Estimated: >60 mL/min
Glucose, Bld: 138 mg/dL — ABNORMAL HIGH (ref 70–99)
Potassium: 4.2 mmol/L (ref 3.5–5.1)
Sodium: 144 mmol/L (ref 135–145)
Total Bilirubin: 0.7 mg/dL (ref 0.0–1.2)
Total Protein: 7.5 g/dL (ref 6.5–8.1)

## 2024-05-10 MED ORDER — ONDANSETRON 4 MG PO TBDP: 4.0000 mg | ORAL_TABLET | Freq: Four times a day (QID) | ORAL | 0 refills | Status: AC | PRN

## 2024-05-10 MED ORDER — OXYCODONE HCL 5 MG PO TABS: 5.0000 mg | ORAL_TABLET | Freq: Three times a day (TID) | ORAL | 0 refills | Status: AC | PRN

## 2024-05-10 MED ORDER — KETOROLAC TROMETHAMINE 30 MG/ML IJ SOLN
30.0000 mg | Freq: Once | INTRAMUSCULAR | Status: AC
  Administered 2024-05-10: 30 mg via INTRAVENOUS
  Filled 2024-05-10: qty 1

## 2024-05-10 MED ORDER — SODIUM CHLORIDE 0.9 % IV BOLUS (SEPSIS)
1000.0000 mL | Freq: Once | INTRAVENOUS | Status: AC
  Administered 2024-05-10: 1000 mL via INTRAVENOUS

## 2024-05-10 MED ORDER — TAMSULOSIN HCL 0.4 MG PO CAPS: 0.4000 mg | ORAL_CAPSULE | Freq: Every day | ORAL | 0 refills | Status: AC

## 2024-05-10 MED ORDER — IBUPROFEN 800 MG PO TABS: 800.0000 mg | ORAL_TABLET | Freq: Three times a day (TID) | ORAL | 0 refills | Status: AC | PRN

## 2024-05-10 MED ORDER — HYDROMORPHONE HCL 1 MG/ML IJ SOLN
1.0000 mg | Freq: Once | INTRAMUSCULAR | Status: AC
  Administered 2024-05-10: 1 mg via INTRAVENOUS
  Filled 2024-05-10: qty 1

## 2024-05-10 MED ORDER — ONDANSETRON HCL 4 MG/2ML IJ SOLN
4.0000 mg | Freq: Once | INTRAMUSCULAR | Status: AC
  Administered 2024-05-10: 4 mg via INTRAVENOUS
  Filled 2024-05-10: qty 2

## 2024-05-10 MED ORDER — HYDROMORPHONE BOLUS VIA INFUSION: 1.0000 mg | Freq: Once | INTRAVENOUS | Status: DC

## 2024-05-10 MED ORDER — MORPHINE SULFATE (PF) 4 MG/ML IV SOLN
4.0000 mg | Freq: Once | INTRAVENOUS | Status: AC
  Administered 2024-05-10: 4 mg via INTRAVENOUS
  Filled 2024-05-10: qty 1

## 2024-05-10 NOTE — ED Provider Notes (Signed)
 "  [facility] Provider Note    Event Date/Time   First MD Initiated Contact with Patient 05/10/24 [phone]     (approximate)   History   Flank Pain   HPI  Sean Walters is a 39 y.o. male with history of kidney stones, irritable bowel syndrome, psoriatic arthritis who presents to the emergency department with complaints of flank pain, vomiting.  No dysuria, hematuria.  Feels like previous kidney stones.  No fevers or diarrhea.   History provided by patient.    Past Medical History:  Diagnosis Date   Chronic prostatitis    Irritable bowel syndrome    Other psoriasis    Psoriatic arthritis (HCC)    Dr Maryl   Sacroiliitis     Past Surgical History:  Procedure Laterality Date   CYSTOSCOPY  [id number]   normal   CYSTOSCOPY/URETEROSCOPY/HOLMIUM LASER/STENT PLACEMENT Right 09/02/2020   Procedure: CYSTOSCOPY/URETEROSCOPY/HOLMIUM LASER/STENT PLACEMENT;  Surgeon: Francisca Redell BROCKS, MD;  Location: [facility] ORS;  Service: Urology;  Laterality: Right;   ESOPHAGOGASTRODUODENOSCOPY  [id number]   US  ECHOCARDIOGRAPHY  07/31/02    MEDICATIONS:  Prior to Admission medications  Medication Sig Start Date End Date Taking? Authorizing Provider  meloxicam  (MOBIC ) 15 MG tablet Take 1 tablet (15 mg total) by mouth daily. 01/22/23   Jimmy Charlie FERNS, MD    Physical Exam   Triage Vital Signs: ED Triage Vitals  Encounter Vitals Group     BP 05/09/24 2332 130/83     Girls Systolic BP Percentile --      Girls Diastolic BP Percentile --      Boys Systolic BP Percentile --      Boys Diastolic BP Percentile --      Pulse Rate 05/09/24 2332 (!) 102     Resp 05/09/24 2332 20     Temp 05/09/24 2332 97.8 F (36.6 C)     Temp Source 05/09/24 2332 Oral     SpO2 05/09/24 2332 97 %     Weight 05/09/24 2317 125 lb (56.7 kg)     Height 05/09/24 2317 5' 11 (1.803 m)     Head Circumference --      Peak Flow --      Pain Score 05/09/24 2317 8     Pain Loc --      Pain Education --       Exclude from Growth Chart --     Most recent vital signs: Vitals:   05/09/24 2332 05/10/24 0330  BP: 130/83 118/75  Pulse: (!) 102 93  Resp: 20 16  Temp: 97.8 F (36.6 C) 97.8 F (36.6 C)  SpO2: 97% 96%    CONSTITUTIONAL: Alert, responds appropriately to questions.  Appears uncomfortable HEAD: Normocephalic, atraumatic EYES: Conjunctivae clear, pupils appear equal, sclera nonicteric ENT: normal nose; moist mucous membranes NECK: Supple, normal ROM CARD: Regular and tachycardic; S1 and S2 appreciated RESP: Normal chest excursion without splinting or tachypnea; breath sounds clear and equal bilaterally; no wheezes, no rhonchi, no rales, no hypoxia or respiratory distress, speaking full sentences ABD/GI: Non-distended; soft, non-tender, no rebound, no guarding, no peritoneal signs BACK: The back appears normal EXT: Normal ROM in all joints; no deformity noted, no edema SKIN: Normal color for age and race; warm; no rash on exposed skin NEURO: Moves all extremities equally, normal speech PSYCH: The patient's mood and manner are appropriate.   ED Results / Procedures / Treatments   LABS: (all labs ordered are listed, but only abnormal results are  displayed) Labs Reviewed  CBC WITH DIFFERENTIAL/PLATELET - Abnormal; Notable for the following components:      Result Value   WBC 13.5 (*)    Lymphs Abs 4.6 (*)    Eosinophils Absolute 0.7 (*)    Basophils Absolute 0.2 (*)    All other components within normal limits  COMPREHENSIVE METABOLIC PANEL WITH GFR - Abnormal; Notable for the following components:   Glucose, Bld 138 (*)    All other components within normal limits  URINALYSIS, ROUTINE W REFLEX MICROSCOPIC - Abnormal; Notable for the following components:   Color, Urine YELLOW (*)    APPearance CLOUDY (*)    Hgb urine dipstick LARGE (*)    Ketones, ur 5 (*)    Protein, ur 30 (*)    Bacteria, UA RARE (*)    All other components within normal limits      EKG:   RADIOLOGY: My personal review and interpretation of imaging: CT scan shows 5 mm stone at the distal right ureter.  I have personally reviewed all radiology reports.   CT Renal Stone Study Result Date: 05/10/2024 EXAM: CT ABDOMEN AND PELVIS WITHOUT CONTRAST 05/10/2024 12:51:33 AM TECHNIQUE: CT of the abdomen and pelvis was performed without the administration of intravenous contrast. Multiplanar reformatted images are provided for review. Automated exposure control, iterative reconstruction, and/or weight-based adjustment of the mA/kV was utilized to reduce the radiation dose to as low as reasonably achievable. COMPARISON: 10/15/2022 CLINICAL HISTORY: Right-sided flank pain for several hours. FINDINGS: LOWER CHEST: No acute abnormality. LIVER: The liver is unremarkable. GALLBLADDER AND BILE DUCTS: Gallbladder is unremarkable. No biliary ductal dilatation. SPLEEN: No acute abnormality. PANCREAS: No acute abnormality. ADRENAL GLANDS: No acute abnormality. KIDNEYS, URETERS AND BLADDER: Tiny nonobstructing renal calculi are noted bilaterally, measuring up to 2 to 3 mm. Mild fullness of the right collecting system is seen. Mild hydroureter is noted on the right as well. A 5 mm stone is noted in the mid to distal right ureter best seen on image number 42 of series 5. The bladder is partially distended. No perinephric or periureteral stranding. GI AND BOWEL: Stomach demonstrates no acute abnormality. Small bowel is unremarkable. No obstructive or inflammatory changes of the colon are seen. The appendix is within normal limits. There is no bowel obstruction. PERITONEUM AND RETROPERITONEUM: No ascites. No free air. VASCULATURE: Aorta is normal in caliber. LYMPH NODES: No lymphadenopathy. REPRODUCTIVE ORGANS: The prostate is within normal limits. BONES AND SOFT TISSUES: No acute osseous abnormality. No focal soft tissue abnormality. IMPRESSION: 1. 5 mm stone in the mid to distal right ureter with  associated mild right hydroureter and mild right collecting system fullness. 2. Tiny nonobstructing renal calculi bilaterally, measuring up to 2 to 3 mm. Electronically signed by: Oneil Devonshire MD 05/10/2024 01:28 AM EST RP Workstation: [id number]     PROCEDURES:  Critical Care performed: No    Procedures    IMPRESSION / MDM / ASSESSMENT AND PLAN / ED COURSE  I reviewed the triage vital signs and the nursing notes.    Patient here with flank pain, vomiting.  History of kidney stones.   DIFFERENTIAL DIAGNOSIS (includes but not limited to):   Kidney stone, ascending UTI, pyelonephritis, appendicitis, diverticulitis, colitis, doubt bowel obstruction or perforation   Patient's presentation is most consistent with acute presentation with potential threat to life or bodily function.   PLAN: Workup initiated from triage.  Patient has leukocytosis of 13,000 likely reactive.  Normal creatinine, LFTs.  Urine shows blood and  some pyuria and bacteria but no nitrites.  He denies dysuria, urinary frequency or urgency.  CT scan reviewed and interpreted by myself and the radiologist and shows a 5 mm stone at the mid to distal right ureter with mild hydroureter and hydronephrosis.  Given oral medications in triage without much relief.  Will give IV fluids, pain and nausea medicine and reassess.   MEDICATIONS GIVEN IN ED: Medications  oxyCODONE -acetaminophen  (PERCOCET/ROXICET) 5-325 MG per tablet 1 tablet (1 tablet Oral Given 05/09/24 2334)  ondansetron  (ZOFRAN -ODT) disintegrating tablet 4 mg (4 mg Oral Given 05/09/24 2334)  sodium chloride  0.9 % bolus 1,000 mL (0 mLs Intravenous Stopped 05/10/24 0411)  morphine  (PF) 4 MG/ML injection 4 mg (4 mg Intravenous Given 05/10/24 0341)  ketorolac  (TORADOL ) 30 MG/ML injection 30 mg (30 mg Intravenous Given 05/10/24 0341)  ondansetron  (ZOFRAN ) injection 4 mg (4 mg Intravenous Given 05/10/24 0341)  HYDROmorphone  (DILAUDID ) injection 1 mg (1 mg Intravenous Given  05/10/24 0448)     ED COURSE: Patient now reports pain has been well-controlled.  He is comfortable with plan for discharge home with pain and nausea medicine, Flomax  and urology follow-up.  Discussed return precautions.   At this time, I do not feel there is any life-threatening condition present. I reviewed all nursing notes, vitals, pertinent previous records.  All lab and urine results, EKGs, imaging ordered have been independently reviewed and interpreted by myself.  I reviewed all available radiology reports from any imaging ordered this visit.  Based on my assessment, I feel the patient is safe to be discharged home without further emergent workup and can continue workup as an outpatient as needed. Discussed all findings, treatment plan as well as usual and customary return precautions.  They verbalize understanding and are comfortable with this plan.  Outpatient follow-up has been provided as needed.  All questions have been answered.    CONSULTS:  none   OUTSIDE RECORDS REVIEWED: Reviewed most recent rheumatology notes.       FINAL CLINICAL IMPRESSION(S) / ED DIAGNOSES   Final diagnoses:  Kidney stone     Rx / DC Orders   ED Discharge Orders     None        Note:  This document was prepared using Dragon voice recognition software and Rossy include unintentional dictation errors.   Samia Kukla, Josette SAILOR, DO 05/10/24 [phone]  "

## 2024-05-10 NOTE — Discharge Instructions (Addendum)

## 2024-05-10 NOTE — ED Notes (Signed)
 Pt called out reporting pain has moved to groin area. Ward, DO made aware and VO received, see MAR

## 2024-05-12 ENCOUNTER — Emergency Department
Admission: EM | Admit: 2024-05-12 | Discharge: 2024-05-12 | Disposition: A | Discharge status: Home / Self Care | Attending: Emergency Medicine | Admitting: Emergency Medicine

## 2024-05-12 ENCOUNTER — Other Ambulatory Visit: Payer: Self-pay

## 2024-05-12 DIAGNOSIS — N200 Calculus of kidney: Secondary | ICD-10-CM | POA: Insufficient documentation

## 2024-05-12 DIAGNOSIS — R10A2 Flank pain, left side: Secondary | ICD-10-CM | POA: Diagnosis present

## 2024-05-12 LAB — URINALYSIS, ROUTINE W REFLEX MICROSCOPIC
Bacteria, UA: NONE SEEN
Bilirubin Urine: NEGATIVE
Glucose, UA: NEGATIVE mg/dL
Hgb urine dipstick: NEGATIVE
Ketones, ur: 5 mg/dL — AB
Leukocytes,Ua: NEGATIVE
Nitrite: POSITIVE — AB
Protein, ur: NEGATIVE mg/dL
Specific Gravity, Urine: 1.025 (ref 1.005–1.030)
Squamous Epithelial / HPF: 0 /HPF (ref 0–5)
pH: 5 (ref 5.0–8.0)

## 2024-05-12 LAB — COMPREHENSIVE METABOLIC PANEL WITH GFR
ALT: 17 U/L (ref 0–44)
AST: 28 U/L (ref 15–41)
Albumin: 4.7 g/dL (ref 3.5–5.0)
Alkaline Phosphatase: 60 U/L (ref 38–126)
Anion gap: 14 (ref 5–15)
BUN: 16 mg/dL (ref 6–20)
CO2: 25 mmol/L (ref 22–32)
Calcium: 9.2 mg/dL (ref 8.9–10.3)
Chloride: 102 mmol/L (ref 98–111)
Creatinine, Ser: 1 mg/dL (ref 0.61–1.24)
GFR, Estimated: >60 mL/min
Glucose, Bld: 145 mg/dL — ABNORMAL HIGH (ref 70–99)
Potassium: 3.7 mmol/L (ref 3.5–5.1)
Sodium: 141 mmol/L (ref 135–145)
Total Bilirubin: 0.9 mg/dL (ref 0.0–1.2)
Total Protein: 7.1 g/dL (ref 6.5–8.1)

## 2024-05-12 LAB — CBC WITH DIFFERENTIAL/PLATELET
Abs Immature Granulocytes: 0.04 K/uL (ref 0.00–0.07)
Basophils Absolute: 0.1 K/uL (ref 0.0–0.1)
Basophils Relative: 1 %
Eosinophils Absolute: 0.1 K/uL (ref 0.0–0.5)
Eosinophils Relative: 1 %
HCT: 41.7 % (ref 39.0–52.0)
Hemoglobin: 13.8 g/dL (ref 13.0–17.0)
Immature Granulocytes: 0 %
Lymphocytes Relative: 17 %
Lymphs Abs: 1.6 K/uL (ref 0.7–4.0)
MCH: 29.4 pg (ref 26.0–34.0)
MCHC: 33.1 g/dL (ref 30.0–36.0)
MCV: 88.7 fL (ref 80.0–100.0)
Monocytes Absolute: 0.6 K/uL (ref 0.1–1.0)
Monocytes Relative: 6 %
Neutro Abs: 7.3 K/uL (ref 1.7–7.7)
Neutrophils Relative %: 75 %
Platelets: 228 K/uL (ref 150–400)
RBC: 4.7 MIL/uL (ref 4.22–5.81)
RDW: 12.4 % (ref 11.5–15.5)
WBC: 9.7 K/uL (ref 4.0–10.5)
nRBC: 0 % (ref 0.0–0.2)

## 2024-05-12 MED ORDER — HYDROMORPHONE HCL 1 MG/ML IJ SOLN
1.0000 mg | Freq: Once | INTRAMUSCULAR | Status: AC
  Administered 2024-05-12: 1 mg via INTRAVENOUS
  Filled 2024-05-12: qty 1

## 2024-05-12 MED ORDER — SODIUM CHLORIDE 0.9 % IV BOLUS (SEPSIS)
1000.0000 mL | Freq: Once | INTRAVENOUS | Status: AC
  Administered 2024-05-12: 1000 mL via INTRAVENOUS

## 2024-05-12 MED ORDER — KETOROLAC TROMETHAMINE 30 MG/ML IJ SOLN
30.0000 mg | Freq: Once | INTRAMUSCULAR | Status: AC
  Administered 2024-05-12: 30 mg via INTRAVENOUS
  Filled 2024-05-12: qty 1

## 2024-05-12 MED ORDER — ONDANSETRON HCL 4 MG/2ML IJ SOLN
4.0000 mg | Freq: Once | INTRAMUSCULAR | Status: AC
  Administered 2024-05-12: 4 mg via INTRAVENOUS
  Filled 2024-05-12: qty 2

## 2024-05-12 NOTE — ED Triage Notes (Signed)
 R flank pain and groin pain. Reports dx with renal stone yesterday. Reports increased pain in groin. Reports increased urinary frequency and does feel that he is emptying completely. Pt endorses nausea. Pt ambulatory. Alert and oriented. Breathing unlabored speaking in full sentences.

## 2024-05-12 NOTE — ED Provider Notes (Signed)
 "  [facility] Provider Note    Event Date/Time   First MD Initiated Contact with Patient 05/12/24 [phone]     (approximate)   History   Flank Pain   HPI  Sean Walters is a 39 y.o. male with history of kidney stones, psoriatic arthritis who presents to the emergency department with left-sided flank pain that radiates into the inguinal area.  Recently diagnosed with a kidney stone.  States pain suddenly worsened last night and uncontrolled with oxycodone  at home.  Has had nausea without vomiting.  No fever.  No dysuria.   History provided by patient.    Past Medical History:  Diagnosis Date   Chronic prostatitis    Irritable bowel syndrome    Other psoriasis    Psoriatic arthritis (HCC)    Dr Maryl   Sacroiliitis     Past Surgical History:  Procedure Laterality Date   CYSTOSCOPY  [id number]   normal   CYSTOSCOPY/URETEROSCOPY/HOLMIUM LASER/STENT PLACEMENT Right 09/02/2020   Procedure: CYSTOSCOPY/URETEROSCOPY/HOLMIUM LASER/STENT PLACEMENT;  Surgeon: Francisca Redell BROCKS, MD;  Location: [facility] ORS;  Service: Urology;  Laterality: Right;   ESOPHAGOGASTRODUODENOSCOPY  [id number]   US  ECHOCARDIOGRAPHY  07/31/02    MEDICATIONS:  Prior to Admission medications  Medication Sig Start Date End Date Taking? Authorizing Provider  ibuprofen  (ADVIL ) 800 MG tablet Take 1 tablet (800 mg total) by mouth every 8 (eight) hours as needed. 05/10/24   Joshia Kitchings, Josette SAILOR, DO  meloxicam  (MOBIC ) 15 MG tablet Take 1 tablet (15 mg total) by mouth daily. 01/22/23   Jimmy Charlie FERNS, MD  ondansetron  (ZOFRAN -ODT) 4 MG disintegrating tablet Take 1 tablet (4 mg total) by mouth every 6 (six) hours as needed for nausea or vomiting. 05/10/24   Kais Monje, Josette SAILOR, DO  oxyCODONE  (ROXICODONE ) 5 MG immediate release tablet Take 1 tablet (5 mg total) by mouth every 8 (eight) hours as needed. 05/10/24 05/10/25  Joretta Eads, Josette SAILOR, DO  tamsulosin  (FLOMAX ) 0.4 MG CAPS capsule Take 1 capsule (0.4 mg total) by mouth  daily. 05/10/24   Myisha Pickerel, Josette SAILOR, DO    Physical Exam   Triage Vital Signs: ED Triage Vitals [05/12/24 0506]  Encounter Vitals Group     BP (!) 152/139     Girls Systolic BP Percentile      Girls Diastolic BP Percentile      Boys Systolic BP Percentile      Boys Diastolic BP Percentile      Pulse Rate (!) 107     Resp 20     Temp (!) 97.5 F (36.4 C)     Temp Source Oral     SpO2 97 %     Weight 123 lb 7.3 oz (56 kg)     Height 5' 11 (1.803 m)     Head Circumference      Peak Flow      Pain Score 10     Pain Loc      Pain Education      Exclude from Growth Chart      Most recent vital signs: Vitals:   05/12/24 0506 05/12/24 0609  BP: (!) 152/139 121/82  Pulse: (!) 107 (!) 107  Resp: 20 (!) 21  Temp: (!) 97.5 F (36.4 C)   SpO2: 97% 100%    CONSTITUTIONAL: Alert, responds appropriately to questions.  Appears extremely uncomfortable, standing upright in the room leaning over the bed HEAD: Normocephalic, atraumatic EYES: Conjunctivae clear, pupils appear equal, sclera nonicteric ENT: normal  nose; moist mucous membranes NECK: Supple, normal ROM CARD: RRR; S1 and S2 appreciated RESP: Normal chest excursion without splinting or tachypnea; breath sounds clear and equal bilaterally; no wheezes, no rhonchi, no rales, no hypoxia or respiratory distress, speaking full sentences ABD/GI: Non-distended; soft, non-tender, no rebound, no guarding, no peritoneal signs BACK: The back appears normal, no CVA tenderness EXT: Normal ROM in all joints; no deformity noted, no edema SKIN: Normal color for age and race; warm; no rash on exposed skin NEURO: Moves all extremities equally, normal speech PSYCH: The patient's mood and manner are appropriate.   ED Results / Procedures / Treatments   LABS: (all labs ordered are listed, but only abnormal results are displayed) Labs Reviewed  URINALYSIS, ROUTINE W REFLEX MICROSCOPIC - Abnormal; Notable for the following components:       Result Value   Color, Urine AMBER (*)    APPearance CLEAR (*)    Ketones, ur 5 (*)    Nitrite POSITIVE (*)    All other components within normal limits  COMPREHENSIVE METABOLIC PANEL WITH GFR - Abnormal; Notable for the following components:   Glucose, Bld 145 (*)    All other components within normal limits  URINE CULTURE  CBC WITH DIFFERENTIAL/PLATELET     EKG:  EKG Interpretation Date/Time:    Ventricular Rate:    PR Interval:    QRS Duration:    QT Interval:    QTC Calculation:   R Axis:      Text Interpretation:           RADIOLOGY: My personal review and interpretation of imaging:    I have personally reviewed all radiology reports.   No results found.   PROCEDURES:  Critical Care performed: No   CRITICAL CARE   Procedures    IMPRESSION / MDM / ASSESSMENT AND PLAN / ED COURSE  I reviewed the triage vital signs and the nursing notes.    Patient here with increasing left-sided flank pain.  Recently diagnosed with a 5 mm ureteral stone in the mid to distal left ureter on CT imaging 2 days ago.     DIFFERENTIAL DIAGNOSIS (includes but not limited to):   Renal colic, less likely ascending UTI or pyelonephritis, doubt appendicitis or diverticulitis   Patient's presentation is most consistent with acute presentation with potential threat to life or bodily function.   PLAN: Will repeat labs, urine to ensure there is no significant change.  Will give IV fluids, pain and nausea medicine.  No indication at this time for repeat imaging.   MEDICATIONS GIVEN IN ED: Medications  sodium chloride  0.9 % bolus 1,000 mL (0 mLs Intravenous Stopped 05/12/24 0556)  HYDROmorphone  (DILAUDID ) injection 1 mg (1 mg Intravenous Given 05/12/24 0525)  ondansetron  (ZOFRAN ) injection 4 mg (4 mg Intravenous Given 05/12/24 0525)  HYDROmorphone  (DILAUDID ) injection 1 mg (1 mg Intravenous Given 05/12/24 0644)     ED COURSE: Labs show no leukocytosis.  Normal creatinine.   Urine shows nitrites but no other sign of infection.  He is not complaining of urinary symptoms but will send urine culture.  Patient still complaining of significant pain.  Will give second dose of Dilaudid .  Signed out the oncoming ED provider at 7 AM to reassess patient for further disposition.   CONSULTS: Dispo pending.   OUTSIDE RECORDS REVIEWED: Reviewed recent rheumatology and family medicine notes.       FINAL CLINICAL IMPRESSION(S) / ED DIAGNOSES   Final diagnoses:  Kidney stone  Rx / DC Orders   ED Discharge Orders     None        Note:  This document was prepared using Dragon voice recognition software and [place] include unintentional dictation errors.   Carlei Huang, Josette SAILOR, DO 05/12/24 [phone]  "

## 2024-05-13 LAB — URINE CULTURE: Culture: NO GROWTH
